# Patient Record
Sex: Female | Born: 1985 | Race: Black or African American | Hispanic: No | Marital: Single | Smoking: Former smoker
Health system: Southern US, Community
[De-identification: ages and names within clinical notes are randomized; demographics above are authoritative.]

## PROBLEM LIST (undated history)

## (undated) DIAGNOSIS — D649 Anemia, unspecified: Secondary | ICD-10-CM

## (undated) DIAGNOSIS — E669 Obesity, unspecified: Secondary | ICD-10-CM

## (undated) HISTORY — PX: NO PAST SURGERIES: SHX2092

---
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
## 2006-01-27 ENCOUNTER — Inpatient Hospital Stay (HOSPITAL_COMMUNITY): Admission: AD | Admit: 2006-01-27 | Discharge: 2006-01-29 | Payer: Self-pay | Admitting: Obstetrics

## 2006-05-10 IMAGING — CR DG FOOT COMPLETE 3+V*R*
3 series · 3 of 3 positions shown · non-contrast
Comparison: none

CLINICAL DATA: Lateral right foot pain following a fall 2 days ago.

RIGHT FOOT - 3 VIEW

[view not recorded (1 of 3)]
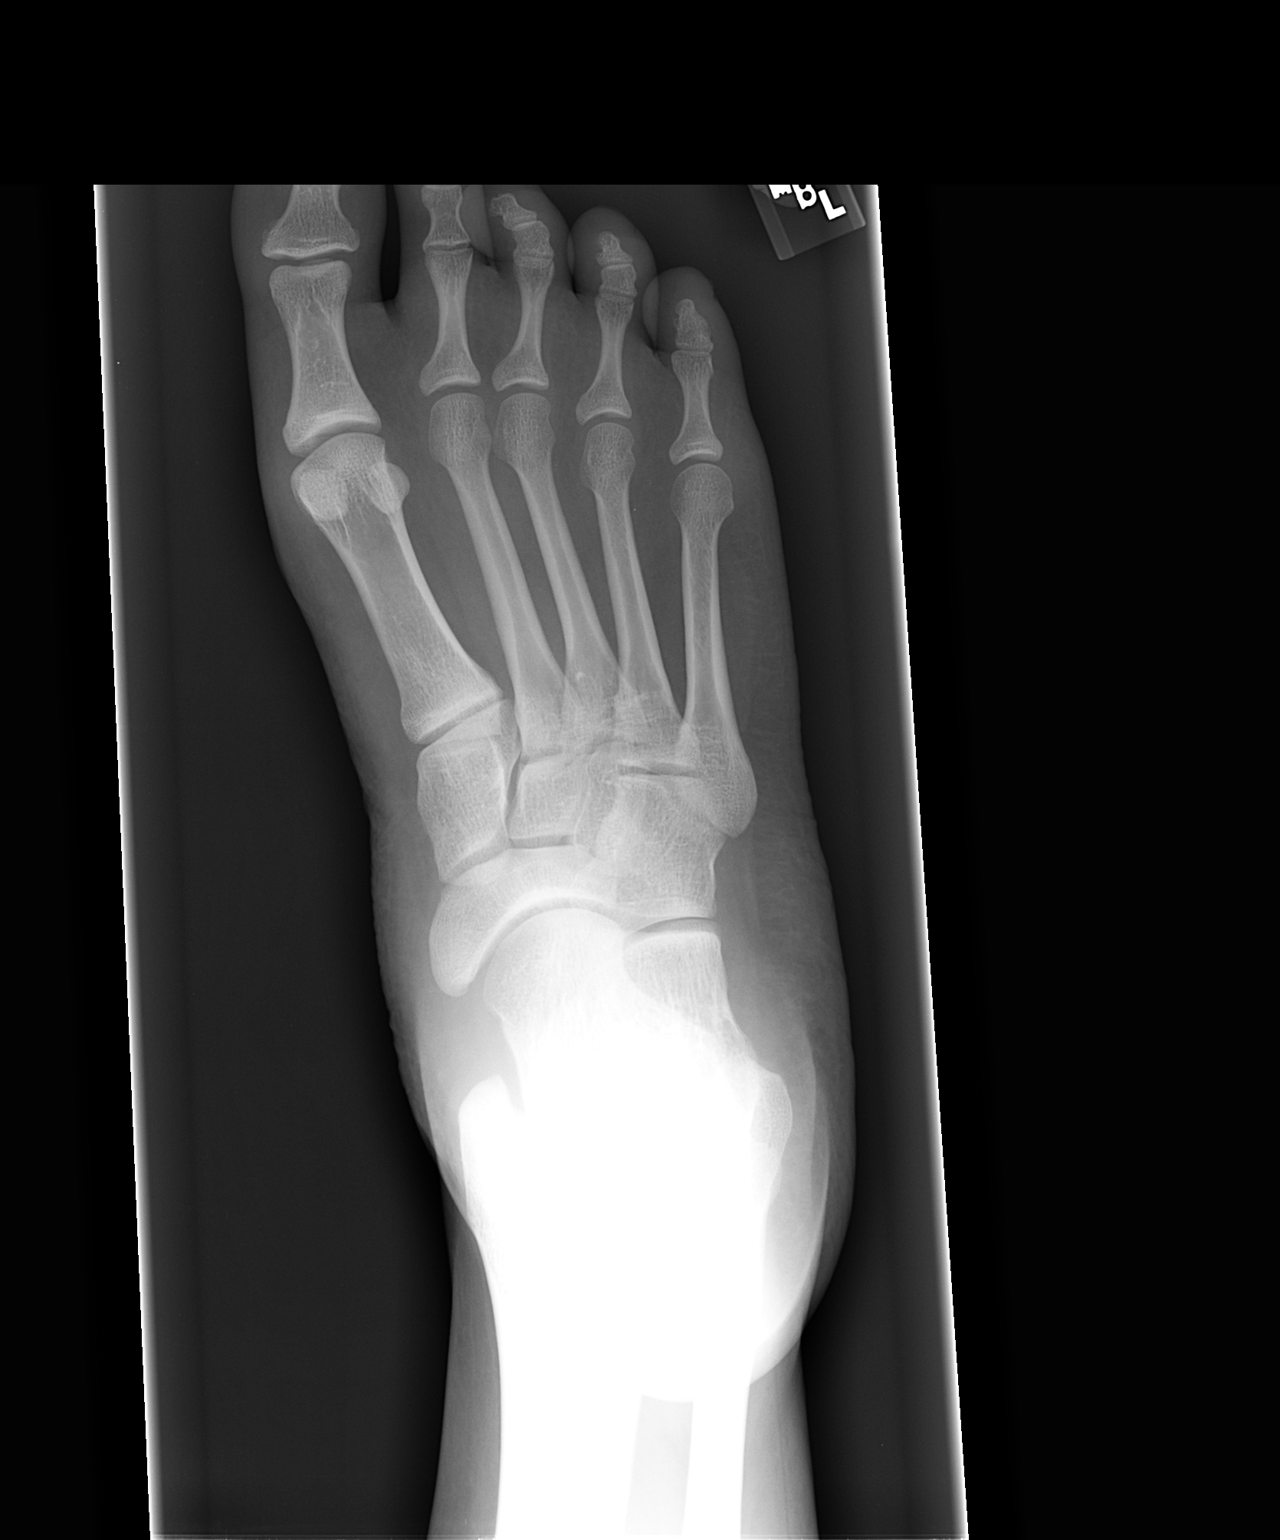

[view not recorded (2 of 3)]
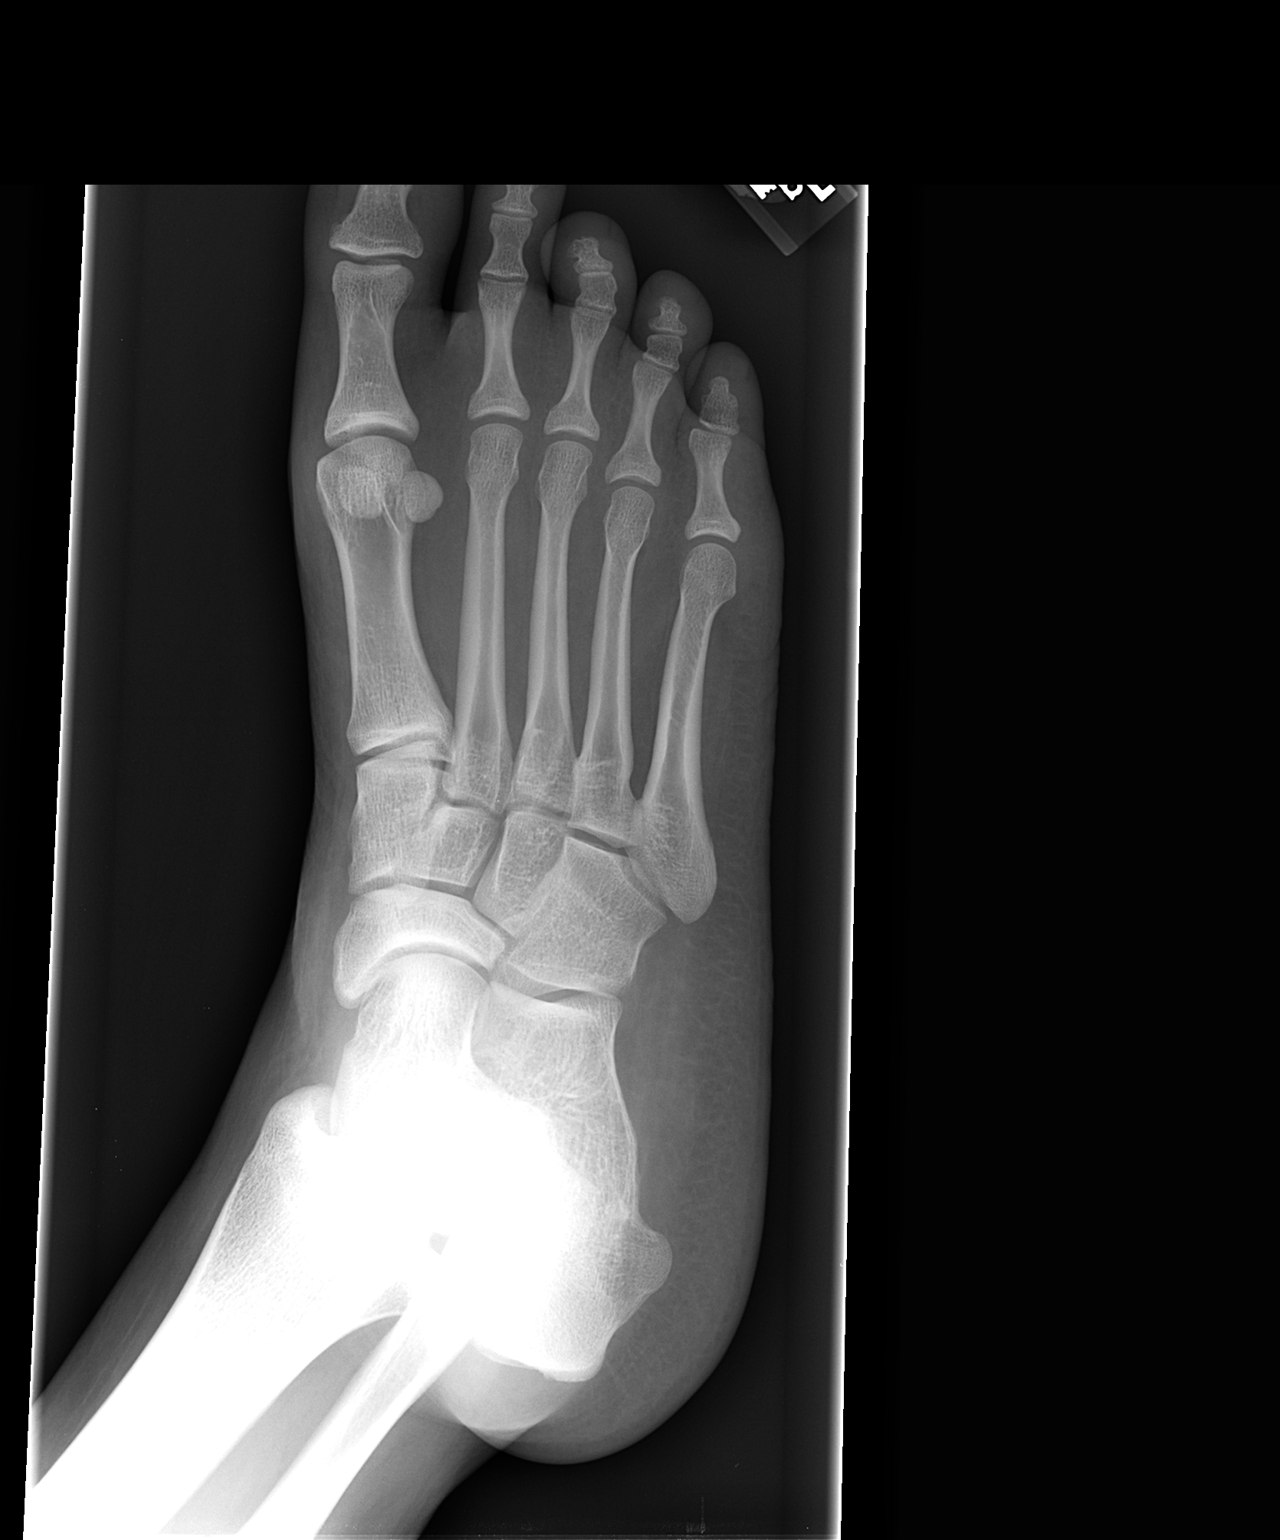

[view not recorded (3 of 3)]
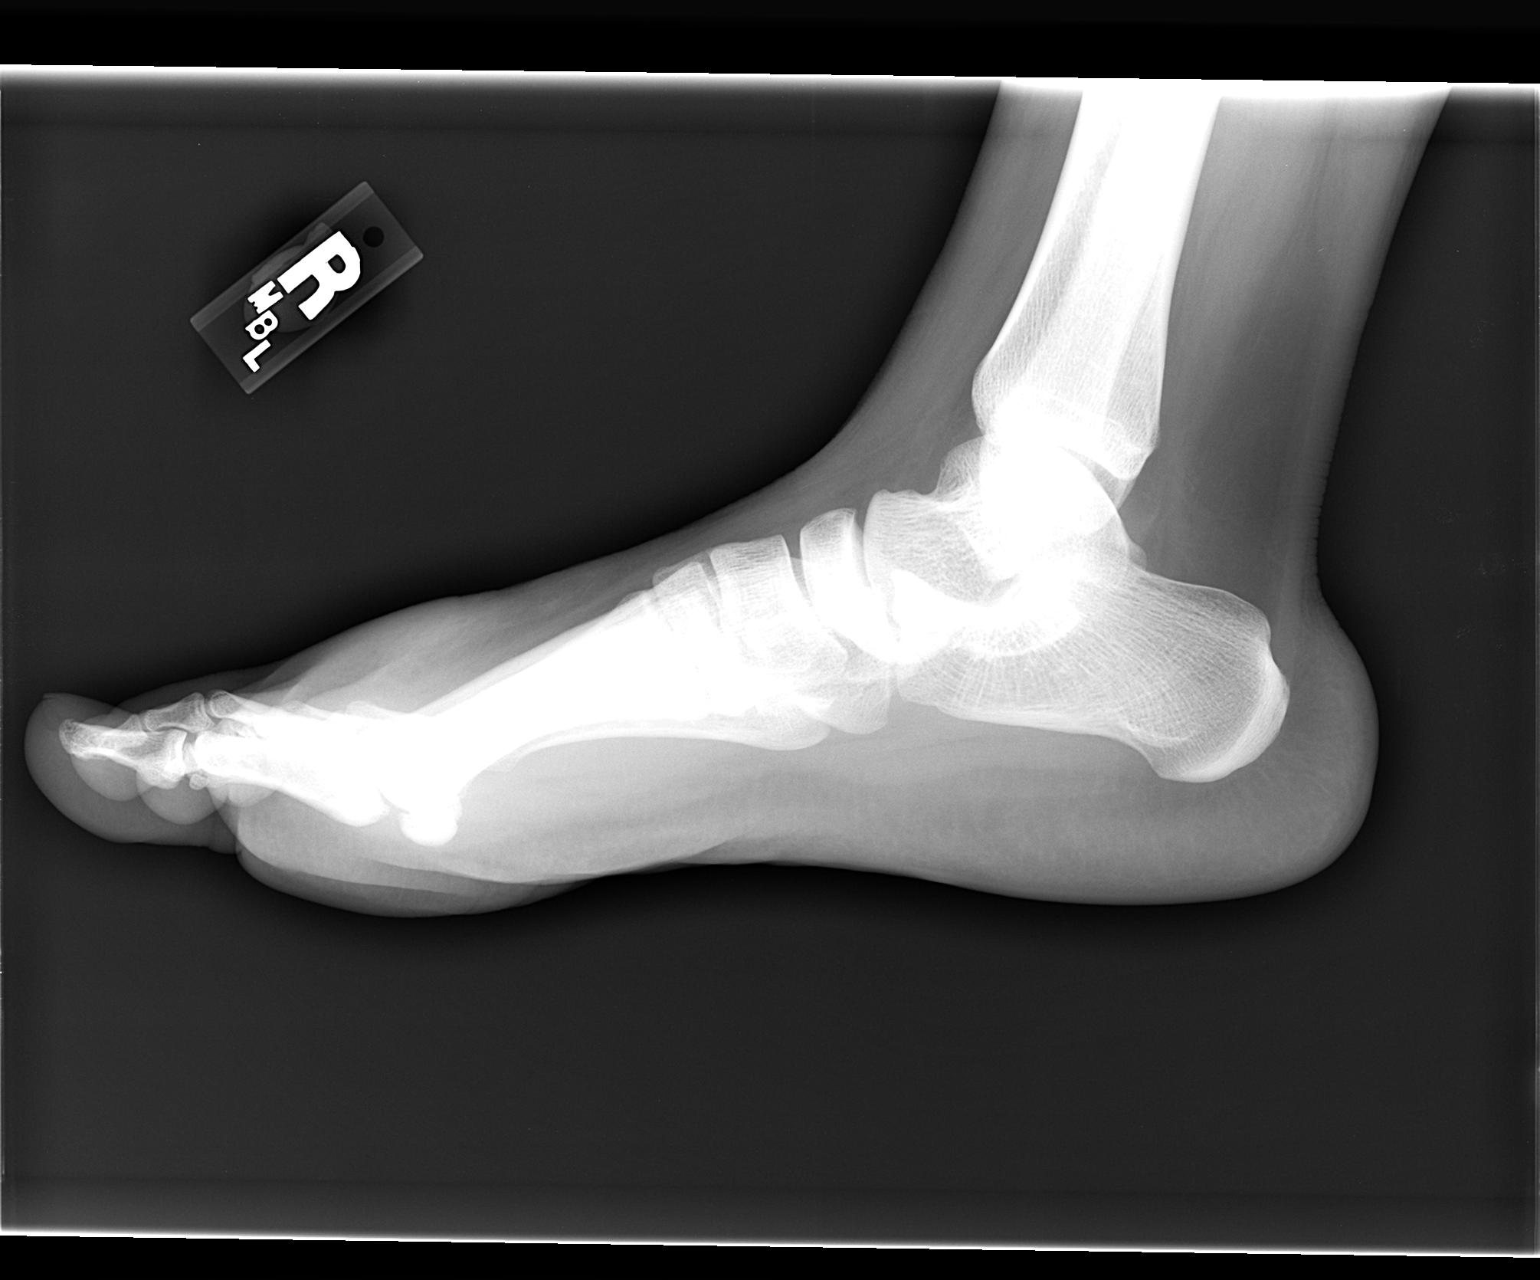

[3 of 3 positions shown; findings below may reference images not displayed]

FINDINGS: Dorsal soft tissue swelling in the distal aspect of the foot. No
fracture or dislocation seen.

IMPRESSION

Dorsal soft tissue swelling distally. No fracture.

## 2006-07-07 ENCOUNTER — Emergency Department (HOSPITAL_COMMUNITY): Admission: EM | Admit: 2006-07-07 | Discharge: 2006-07-07 | Payer: Self-pay | Admitting: Emergency Medicine

## 2007-06-02 ENCOUNTER — Emergency Department (HOSPITAL_COMMUNITY): Admission: EM | Admit: 2007-06-02 | Discharge: 2007-06-02 | Payer: Self-pay | Admitting: Emergency Medicine

## 2007-11-25 ENCOUNTER — Emergency Department (HOSPITAL_COMMUNITY): Admission: EM | Admit: 2007-11-25 | Discharge: 2007-11-25 | Payer: Self-pay | Admitting: Emergency Medicine

## 2008-06-17 ENCOUNTER — Emergency Department (HOSPITAL_COMMUNITY): Admission: EM | Admit: 2008-06-17 | Discharge: 2008-06-17 | Payer: Self-pay | Admitting: Emergency Medicine

## 2011-05-05 LAB — POCT PREGNANCY, URINE: Preg Test, Ur: NEGATIVE

## 2011-05-05 LAB — CBC
MCHC: 33.3
MCV: 80.8
Platelets: 321
WBC: 8.1

## 2011-05-05 LAB — DIFFERENTIAL
Basophils Absolute: 0
Basophils Relative: 0
Eosinophils Absolute: 0.3
Neutrophils Relative %: 58

## 2011-05-05 LAB — GC/CHLAMYDIA PROBE AMP, GENITAL
Chlamydia, DNA Probe: NEGATIVE
GC Probe Amp, Genital: NEGATIVE

## 2012-06-11 ENCOUNTER — Encounter (HOSPITAL_COMMUNITY): Payer: Self-pay | Admitting: *Deleted

## 2012-06-11 ENCOUNTER — Emergency Department (HOSPITAL_COMMUNITY)
Admission: EM | Admit: 2012-06-11 | Discharge: 2012-06-11 | Disposition: A | Discharge status: Home / Self Care | Payer: Self-pay | Attending: Emergency Medicine | Admitting: Emergency Medicine

## 2012-06-11 DIAGNOSIS — F17200 Nicotine dependence, unspecified, uncomplicated: Secondary | ICD-10-CM | POA: Insufficient documentation

## 2012-06-11 DIAGNOSIS — K029 Dental caries, unspecified: Secondary | ICD-10-CM | POA: Insufficient documentation

## 2012-06-11 MED ORDER — TRAMADOL HCL 50 MG PO TABS
50.0000 mg | ORAL_TABLET | Freq: Once | ORAL | Status: AC
  Administered 2012-06-11: 50 mg via ORAL
  Filled 2012-06-11: qty 1

## 2012-06-11 MED ORDER — TRAMADOL HCL 50 MG PO TABS: 50.0000 mg | ORAL_TABLET | Freq: Four times a day (QID) | ORAL | Status: DC | PRN

## 2012-06-11 NOTE — ED Notes (Signed)
Pt states that she has cavities and could not get them filled. Pt states that the teeth are now pretty much gone and she is having dental pain and gum pain and mouth pain for about a month.

## 2012-06-11 NOTE — ED Provider Notes (Signed)
History     CSN: [id number]  Arrival date & time 06/11/12  1478   First MD Initiated Contact with Patient 06/11/12 1950      Chief Complaint  Patient presents with  . Dental Pain    (Consider location/radiation/quality/duration/timing/severity/associated sxs/prior treatment) HPI Comments: Patient with dental pain for the past 7 yeas now with constant pain . Has been unable to see a dentist due to cost. Has been intermittent taking Goody's Powder without relief   Patient is a 26 y.o. female presenting with tooth pain. The history is provided by the patient.  Dental PainThe primary symptoms include mouth pain. Primary symptoms do not include headaches or fever. The symptoms began more than 1 month ago. The symptoms are worsening. The symptoms occur frequently.  Additional symptoms include: dental sensitivity to temperature. Additional symptoms do not include: gum swelling, gum tenderness, purulent gums, jaw pain, trouble swallowing and ear pain.    History reviewed. No pertinent past medical history.  History reviewed. No pertinent past surgical history.  History reviewed. No pertinent family history.  History  Substance Use Topics  . Smoking status: Current Every Day Smoker  . Smokeless tobacco: Not on file  . Alcohol Use: No    OB History    Grav Para Term Preterm Abortions TAB SAB Ect Mult Living                  Review of Systems  Constitutional: Negative for fever, activity change and appetite change.  HENT: Positive for dental problem. Negative for ear pain and trouble swallowing.   Gastrointestinal: Negative for vomiting.  Genitourinary: Negative.   Musculoskeletal: Negative.   Neurological: Negative for headaches.  All other systems reviewed and are negative.    Allergies  Review of patient's allergies indicates no known allergies.  Home Medications   Current Outpatient Rx  Name  Route  Sig  Dispense  Refill  . TRAMADOL HCL 50 MG PO TABS   Oral  Take 1 tablet (50 mg total) by mouth every 6 (six) hours as needed for pain.   30 tablet   0     BP 132/78  Pulse 109  Temp 98.9 F (37.2 C) (Oral)  Resp 18  SpO2 99%  Physical Exam  Nursing note and vitals reviewed. Constitutional: She is oriented to person, place, and time. She appears well-developed.       obese  HENT:  Head: Normocephalic.  Right Ear: External ear normal.  Left Ear: External ear normal.  Mouth/Throat: Oropharynx is clear and moist.         Multiple small cavities upper and lower molars No gum swelling   Eyes: Pupils are equal, round, and reactive to light.  Neck: Normal range of motion.  Cardiovascular: Regular rhythm.  Tachycardia present.   Pulmonary/Chest: Effort normal.  Musculoskeletal: Normal range of motion.  Neurological: She is alert and oriented to person, place, and time.  Skin: Skin is warm.    ED Course  Procedures (including critical care time)  Labs Reviewed - No data to display No results found.   1. Dental cavities       MDM  Dental caries        Arman Filter, NP 06/11/12 2016  Arman Filter, NP 06/11/12 2016

## 2012-06-11 NOTE — ED Provider Notes (Signed)
Medical screening examination/treatment/procedure(s) were performed by non-physician practitioner and as supervising physician I was immediately available for consultation/collaboration.   Rolan Bucco, MD 06/11/12 2250

## 2014-01-09 ENCOUNTER — Encounter (HOSPITAL_COMMUNITY): Payer: Self-pay | Admitting: Emergency Medicine

## 2014-01-09 ENCOUNTER — Emergency Department (HOSPITAL_COMMUNITY)
Admission: EM | Admit: 2014-01-09 | Discharge: 2014-01-09 | Disposition: A | Discharge status: Home / Self Care | Payer: Medicaid Other | Attending: Emergency Medicine | Admitting: Emergency Medicine

## 2014-01-09 DIAGNOSIS — Y990 Civilian activity done for income or pay: Secondary | ICD-10-CM | POA: Insufficient documentation

## 2014-01-09 DIAGNOSIS — W268XXA Contact with other sharp object(s), not elsewhere classified, initial encounter: Secondary | ICD-10-CM | POA: Insufficient documentation

## 2014-01-09 DIAGNOSIS — S61409A Unspecified open wound of unspecified hand, initial encounter: Secondary | ICD-10-CM | POA: Insufficient documentation

## 2014-01-09 DIAGNOSIS — Y93G1 Activity, food preparation and clean up: Secondary | ICD-10-CM | POA: Insufficient documentation

## 2014-01-09 DIAGNOSIS — Z23 Encounter for immunization: Secondary | ICD-10-CM | POA: Insufficient documentation

## 2014-01-09 DIAGNOSIS — S61412A Laceration without foreign body of left hand, initial encounter: Secondary | ICD-10-CM

## 2014-01-09 DIAGNOSIS — Y9289 Other specified places as the place of occurrence of the external cause: Secondary | ICD-10-CM | POA: Insufficient documentation

## 2014-01-09 DIAGNOSIS — F17200 Nicotine dependence, unspecified, uncomplicated: Secondary | ICD-10-CM | POA: Insufficient documentation

## 2014-01-09 MED ORDER — TETANUS-DIPHTH-ACELL PERTUSSIS 5-2.5-18.5 LF-MCG/0.5 IM SUSP
0.5000 mL | Freq: Once | INTRAMUSCULAR | Status: AC
  Administered 2014-01-09: 0.5 mL via INTRAMUSCULAR
  Filled 2014-01-09: qty 0.5

## 2014-01-09 NOTE — Discharge Instructions (Signed)
Keep your laceration clean and dry. Apply bacitracin twice a day. Follow up in 7 days for suture removal. Watch for signs of infection.   Laceration Care, Adult A laceration is a cut or lesion that goes through all layers of the skin and into the tissue just beneath the skin. TREATMENT  Some lacerations may not require closure. Some lacerations may not be able to be closed due to an increased risk of infection. It is important to see your caregiver as soon as possible after an injury to minimize the risk of infection and maximize the opportunity for successful closure. If closure is appropriate, pain medicines may be given, if needed. The wound will be cleaned to help prevent infection. Your caregiver will use stitches (sutures), staples, wound glue (adhesive), or skin adhesive strips to repair the laceration. These tools bring the skin edges together to allow for faster healing and a better cosmetic outcome. However, all wounds will heal with a scar. Once the wound has healed, scarring can be minimized by covering the wound with sunscreen during the day for 1 full year. HOME CARE INSTRUCTIONS  For sutures or staples:  Keep the wound clean and dry.  If you were given a bandage (dressing), you should change it at least once a day. Also, change the dressing if it becomes wet or dirty, or as directed by your caregiver.  Wash the wound with soap and water 2 times a day. Rinse the wound off with water to remove all soap. Pat the wound dry with a clean towel.  After cleaning, apply a thin layer of the antibiotic ointment as recommended by your caregiver. This will help prevent infection and keep the dressing from sticking.  You may shower as usual after the first 24 hours. Do not soak the wound in water until the sutures are removed.  Only take over-the-counter or prescription medicines for pain, discomfort, or fever as directed by your caregiver.  Get your sutures or staples removed as directed by  your caregiver. For skin adhesive strips:  Keep the wound clean and dry.  Do not get the skin adhesive strips wet. You may bathe carefully, using caution to keep the wound dry.  If the wound gets wet, pat it dry with a clean towel.  Skin adhesive strips will fall off on their own. You may trim the strips as the wound heals. Do not remove skin adhesive strips that are still stuck to the wound. They will fall off in time. For wound adhesive:  You may briefly wet your wound in the shower or bath. Do not soak or scrub the wound. Do not swim. Avoid periods of heavy perspiration until the skin adhesive has fallen off on its own. After showering or bathing, gently pat the wound dry with a clean towel.  Do not apply liquid medicine, cream medicine, or ointment medicine to your wound while the skin adhesive is in place. This may loosen the film before your wound is healed.  If a dressing is placed over the wound, be careful not to apply tape directly over the skin adhesive. This may cause the adhesive to be pulled off before the wound is healed.  Avoid prolonged exposure to sunlight or tanning lamps while the skin adhesive is in place. Exposure to ultraviolet light in the first year will darken the scar.  The skin adhesive will usually remain in place for 5 to 10 days, then naturally fall off the skin. Do not pick at the adhesive  film. You may need a tetanus shot if:  You cannot remember when you had your last tetanus shot.  You have never had a tetanus shot. If you get a tetanus shot, your arm may swell, get red, and feel warm to the touch. This is common and not a problem. If you need a tetanus shot and you choose not to have one, there is a rare chance of getting tetanus. Sickness from tetanus can be serious. SEEK MEDICAL CARE IF:   You have redness, swelling, or increasing pain in the wound.  You see a red line that goes away from the wound.  You have yellowish-white fluid (pus) coming  from the wound.  You have a fever.  You notice a bad smell coming from the wound or dressing.  Your wound breaks open before or after sutures have been removed.  You notice something coming out of the wound such as wood or glass.  Your wound is on your hand or foot and you cannot move a finger or toe. SEEK IMMEDIATE MEDICAL CARE IF:   Your pain is not controlled with prescribed medicine.  You have severe swelling around the wound causing pain and numbness or a change in color in your arm, hand, leg, or foot.  Your wound splits open and starts bleeding.  You have worsening numbness, weakness, or loss of function of any joint around or beyond the wound.  You develop painful lumps near the wound or on the skin anywhere on your body. MAKE SURE YOU:   Understand these instructions.  Will watch your condition.  Will get help right away if you are not doing well or get worse. Document Released: 07/13/2005 Document Revised: 10/05/2011 Document Reviewed: 01/06/2011 [facility] Patient Information 2014 [place], [place].

## 2014-01-09 NOTE — ED Provider Notes (Signed)
Medical screening examination/treatment/procedure(s) were performed by non-physician practitioner and as supervising physician I was immediately available for consultation/collaboration.   EKG Interpretation None        Gilda Creasehristopher J. Pollina, MD 01/09/14 1454

## 2014-01-09 NOTE — ED Notes (Signed)
.  5 cm Laceration to inner aspect of l/hand. Injury at 0400, cut on tea urn at work. Bleeding controlled

## 2014-01-09 NOTE — ED Provider Notes (Signed)
CSN: [id number]     Arrival date & time 01/09/14  1338 History  This chart was scribed for Jaynie Crumbleatyana Kirichenko, PA, working with Gilda Creasehristopher J. Pollina, *, by Bronson CurbJacqueline Melvin, ED Scribe. This patient was seen in room WTR6/WTR6 and the patient's care was started at 2:20 PM.     Chief Complaint  Patient presents with  . Extremity Laceration    .5 cm shallow laceration to l/hand    The history is provided by the patient. No language interpreter was used.    HPI Comments: Shannon Rivas is a 28 y.o. female who presents to the Emergency Department complaining of left hand laceration that occurred while she was washing dishes this morning at 3 AM. Patient states she sliced her left hand on a metal tea urn (tea urn is still intact). There is associated controlled bleeding and pain with extension. She rates her pain as 4/10. Patient is not UTD on tetanus.   History reviewed. No pertinent past medical history. History reviewed. No pertinent past surgical history. Family History  Problem Relation Age of Onset  . Diabetes Other   . Hypertension Other    History  Substance Use Topics  . Smoking status: Current Every Day Smoker    Types: Cigarettes  . Smokeless tobacco: Not on file  . Alcohol Use: No   OB History   Grav Para Term Preterm Abortions TAB SAB Ect Mult Living                 Review of Systems  Skin: Positive for wound.      Allergies  Review of patient's allergies indicates no known allergies.  Home Medications   Prior to Admission medications   Not on File   Triage Vitals: BP 105/62  Pulse 91  Temp(Src) 98.1 F (36.7 C) (Oral)  Resp 18  Wt 240 lb (108.863 kg)  SpO2 100%  LMP 08/27/2013  Physical Exam  Nursing note and vitals reviewed. Constitutional: She is oriented to person, place, and time. She appears well-developed and well-nourished. No distress.  HENT:  Head: Normocephalic and atraumatic.  Eyes: Conjunctivae and EOM are normal.  Neck: Neck  supple. No tracheal deviation present.  Cardiovascular: Normal rate.   Pulmonary/Chest: Effort normal. No respiratory distress.  Musculoskeletal: Normal range of motion.  Full ROM of 2nd finger, distal to the 2 cm laceration overlying palmar surface of MCP joint. Good strength against resistance with flexion and extension at each joint. Sensation intact over finger. Cap refill <2sec distally.   Neurological: She is alert and oriented to person, place, and time.  Skin: Skin is warm and dry.  2cm laceration to the left palm of the hand over 2nd MCP joint. Hemostatic.   Psychiatric: She has a normal mood and affect. Her behavior is normal.    ED Course  Procedures (including critical care time)  DIAGNOSTIC STUDIES: Oxygen Saturation is 100% on room air, normal by my interpretation.    COORDINATION OF CARE: At 1426 Discussed treatment plan with patient which includes laceration repair. Patient agrees.    LACERATION REPAIR PROCEDURE NOTE The patient's identification was confirmed and consent was obtained. This procedure was performed by Anselm LisStephanie Meiners PA-S under supervision of Tatyana Kirichenko PA-C at 2:29 PM. Site: left hand Sterile procedures observed  Anesthetic used (type and amt): 2% w epi 2cc Suture type/size:prolene 5.0 Length: 2cm # of Sutures: 3 Technique: simple interrupted Antibx ointment applied  Tetanus ordered Site anesthetized, irrigated with NS, explored without evidence of  foreign body, wound well approximated, site covered with dry, sterile dressing.  Patient tolerated procedure well without complications. Instructions for care discussed verbally and patient provided with additional written instructions for homecare and f/u.   Labs Review Labs Reviewed - No data to display  Imaging Review No results found.   EKG Interpretation None      MDM   Final diagnoses:  Laceration of hand, left    Patient's with a 2 cm laceration to the left hand. No  obvious tendon, nerve, vascular injury based on exam. Wound repaired with sutures. No concern about foreign body given cut on a metal object it still intact. Tetanus updated. Home with followup and suture removal.  Filed Vitals:   01/09/14 1402  BP: 105/62  Pulse: 91  Temp: 98.1 F (36.7 C)  TempSrc: Oral  Resp: 18  Weight: 240 lb (108.863 kg)  SpO2: 100%    I personally performed the services described in this documentation, which was scribed in my presence. The recorded information has been reviewed and is accurate.    Lottie Musselatyana A Kirichenko, PA-C 01/09/14 1450

## 2014-02-04 ENCOUNTER — Encounter (HOSPITAL_COMMUNITY): Payer: Self-pay | Admitting: Emergency Medicine

## 2014-02-04 ENCOUNTER — Emergency Department (HOSPITAL_COMMUNITY)
Admission: EM | Admit: 2014-02-04 | Discharge: 2014-02-04 | Disposition: A | Discharge status: Home / Self Care | Payer: Medicaid Other | Attending: Emergency Medicine | Admitting: Emergency Medicine

## 2014-02-04 ENCOUNTER — Emergency Department (HOSPITAL_COMMUNITY): Payer: Medicaid Other

## 2014-02-04 DIAGNOSIS — S335XXA Sprain of ligaments of lumbar spine, initial encounter: Secondary | ICD-10-CM | POA: Insufficient documentation

## 2014-02-04 DIAGNOSIS — S4980XA Other specified injuries of shoulder and upper arm, unspecified arm, initial encounter: Secondary | ICD-10-CM | POA: Insufficient documentation

## 2014-02-04 DIAGNOSIS — Y92410 Unspecified street and highway as the place of occurrence of the external cause: Secondary | ICD-10-CM | POA: Insufficient documentation

## 2014-02-04 DIAGNOSIS — E669 Obesity, unspecified: Secondary | ICD-10-CM | POA: Diagnosis not present

## 2014-02-04 DIAGNOSIS — M25511 Pain in right shoulder: Secondary | ICD-10-CM

## 2014-02-04 DIAGNOSIS — F17200 Nicotine dependence, unspecified, uncomplicated: Secondary | ICD-10-CM | POA: Insufficient documentation

## 2014-02-04 DIAGNOSIS — Y9389 Activity, other specified: Secondary | ICD-10-CM | POA: Diagnosis not present

## 2014-02-04 DIAGNOSIS — S39012A Strain of muscle, fascia and tendon of lower back, initial encounter: Secondary | ICD-10-CM

## 2014-02-04 DIAGNOSIS — S46909A Unspecified injury of unspecified muscle, fascia and tendon at shoulder and upper arm level, unspecified arm, initial encounter: Secondary | ICD-10-CM | POA: Insufficient documentation

## 2014-02-04 HISTORY — DX: Obesity, unspecified: E66.9

## 2014-02-04 MED ORDER — HYDROCODONE-ACETAMINOPHEN 5-325 MG PO TABS: 1.0000 | ORAL_TABLET | Freq: Four times a day (QID) | ORAL | Status: DC | PRN

## 2014-02-04 MED ORDER — DIAZEPAM 5 MG PO TABS: 5.0000 mg | ORAL_TABLET | Freq: Two times a day (BID) | ORAL | Status: DC | PRN

## 2014-02-04 NOTE — Discharge Instructions (Signed)
Lumbosacral Strain Lumbosacral strain is a strain of any of the parts that make up your lumbosacral vertebrae. Your lumbosacral vertebrae are the bones that make up the lower third of your backbone. Your lumbosacral vertebrae are held together by muscles and tough, fibrous tissue (ligaments).  CAUSES  A sudden blow to your back can cause lumbosacral strain. Also, anything that causes an excessive stretch of the muscles in the low back can cause this strain. This is typically seen when people exert themselves strenuously, fall, lift heavy objects, bend, or crouch repeatedly. RISK FACTORS  Physically demanding work.  Participation in pushing or pulling sports or sports that require a sudden twist of the back (tennis, golf, baseball).  Weight lifting.  Excessive lower back curvature.  Forward-tilted pelvis.  Weak back or abdominal muscles or both.  Tight hamstrings. SIGNS AND SYMPTOMS  Lumbosacral strain may cause pain in the area of your injury or pain that moves (radiates) down your leg.  DIAGNOSIS Your health care provider can often diagnose lumbosacral strain through a physical exam. In some cases, you may need tests such as X-ray exams.  TREATMENT  Treatment for your lower back injury depends on many factors that your clinician will have to evaluate. However, most treatment will include the use of anti-inflammatory medicines. HOME CARE INSTRUCTIONS   Avoid hard physical activities (tennis, racquetball, waterskiing) if you are not in proper physical condition for it. This may aggravate or create problems.  If you have a back problem, avoid sports requiring sudden body movements. Swimming and walking are generally safer activities.  Maintain good posture.  Maintain a healthy weight.  For acute conditions, you may put ice on the injured area.  Put ice in a plastic bag.  Place a towel between your skin and the bag.  Leave the ice on for 20 minutes, 2-3 times a day.  When the  low back starts healing, stretching and strengthening exercises may be recommended. SEEK MEDICAL CARE IF:  Your back pain is getting worse.  You experience severe back pain not relieved with medicines. SEEK IMMEDIATE MEDICAL CARE IF:   You have numbness, tingling, weakness, or problems with the use of your arms or legs.  There is a change in bowel or bladder control.  You have increasing pain in any area of the body, including your belly (abdomen).  You notice shortness of breath, dizziness, or feel faint.  You feel sick to your stomach (nauseous), are throwing up (vomiting), or become sweaty.  You notice discoloration of your toes or legs, or your feet get very cold. MAKE SURE YOU:   Understand these instructions.  Will watch your condition.  Will get help right away if you are not doing well or get worse. Document Released: 04/22/2005 Document Revised: 07/18/2013 Document Reviewed: 03/01/2013 [facility] Patient Information 2015 [place], [place]. This information is not intended to replace advice given to you by your health care provider. Make sure you discuss any questions you have with your health care provider. Shoulder Pain The shoulder is the joint that connects your arms to your body. The bones that form the shoulder joint include the upper arm bone (humerus), the shoulder blade (scapula), and the collarbone (clavicle). The top of the humerus is shaped like a ball and fits into a rather flat socket on the scapula (glenoid cavity). A combination of muscles and strong, fibrous tissues that connect muscles to bones (tendons) support your shoulder joint and hold the ball in the socket. Small, fluid-filled sacs (bursae)  are located in different areas of the joint. They act as cushions between the bones and the overlying soft tissues and help reduce friction between the gliding tendons and the bone as you move your arm. Your shoulder joint allows a wide range of motion in your arm. This  range of motion allows you to do things like scratch your back or throw a ball. However, this range of motion also makes your shoulder more prone to pain from overuse and injury. Causes of shoulder pain can originate from both injury and overuse and usually can be grouped in the following four categories:  Redness, swelling, and pain (inflammation) of the tendon (tendinitis) or the bursae (bursitis).  Instability, such as a dislocation of the joint.  Inflammation of the joint (arthritis).  Broken bone (fracture). HOME CARE INSTRUCTIONS   Apply ice to the sore area.  Put ice in a plastic bag.  Place a towel between your skin and the bag.  Leave the ice on for 15-20 minutes, 3-4 times per day for the first 2 days, or as directed by your health care provider.  Stop using cold packs if they do not help with the pain.  If you have a shoulder sling or immobilizer, wear it as long as your caregiver instructs. Only remove it to shower or bathe. Move your arm as little as possible, but keep your hand moving to prevent swelling.  Squeeze a soft ball or foam pad as much as possible to help prevent swelling.  Only take over-the-counter or prescription medicines for pain, discomfort, or fever as directed by your caregiver. SEEK MEDICAL CARE IF:   Your shoulder pain increases, or new pain develops in your arm, hand, or fingers.  Your hand or fingers become cold and numb.  Your pain is not relieved with medicines. SEEK IMMEDIATE MEDICAL CARE IF:   Your arm, hand, or fingers are numb or tingling.  Your arm, hand, or fingers are significantly swollen or turn white or blue. MAKE SURE YOU:   Understand these instructions.  Will watch your condition.  Will get help right away if you are not doing well or get worse. Document Released: 04/22/2005 Document Revised: 07/18/2013 Document Reviewed: 06/27/2011 [facility] Patient Information 2015 [place], [place]. This information is not intended to  replace advice given to you by your health care provider. Make sure you discuss any questions you have with your health care provider.
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
## 2014-02-12 ENCOUNTER — Encounter (HOSPITAL_COMMUNITY): Payer: Self-pay | Admitting: Emergency Medicine

## 2014-02-12 ENCOUNTER — Emergency Department (HOSPITAL_COMMUNITY)
Admission: EM | Admit: 2014-02-12 | Discharge: 2014-02-12 | Disposition: A | Discharge status: Home / Self Care | Payer: No Typology Code available for payment source | Attending: Emergency Medicine | Admitting: Emergency Medicine

## 2014-02-12 ENCOUNTER — Emergency Department (INDEPENDENT_AMBULATORY_CARE_PROVIDER_SITE_OTHER)
Admission: EM | Admit: 2014-02-12 | Discharge: 2014-02-12 | Disposition: A | Discharge status: Nursing Home (Includes ICF) | Payer: Self-pay | Source: Home / Self Care | Attending: Family Medicine | Admitting: Family Medicine

## 2014-02-12 DIAGNOSIS — M549 Dorsalgia, unspecified: Secondary | ICD-10-CM

## 2014-02-12 DIAGNOSIS — G8911 Acute pain due to trauma: Secondary | ICD-10-CM | POA: Insufficient documentation

## 2014-02-12 DIAGNOSIS — Z760 Encounter for issue of repeat prescription: Secondary | ICD-10-CM | POA: Diagnosis present

## 2014-02-12 DIAGNOSIS — F17200 Nicotine dependence, unspecified, uncomplicated: Secondary | ICD-10-CM | POA: Insufficient documentation

## 2014-02-12 DIAGNOSIS — M542 Cervicalgia: Secondary | ICD-10-CM

## 2014-02-12 DIAGNOSIS — E669 Obesity, unspecified: Secondary | ICD-10-CM | POA: Diagnosis not present

## 2014-02-12 DIAGNOSIS — M25519 Pain in unspecified shoulder: Secondary | ICD-10-CM | POA: Diagnosis not present

## 2014-02-12 DIAGNOSIS — M25511 Pain in right shoulder: Secondary | ICD-10-CM

## 2014-02-12 MED ORDER — DIAZEPAM 5 MG PO TABS: 5.0000 mg | ORAL_TABLET | Freq: Two times a day (BID) | ORAL | Status: DC | PRN

## 2014-02-12 MED ORDER — HYDROCODONE-ACETAMINOPHEN 5-325 MG PO TABS
1.0000 | ORAL_TABLET | Freq: Four times a day (QID) | ORAL | Status: DC | PRN
Start: 2014-02-12 — End: 2017-05-29

## 2014-02-12 MED ORDER — DIAZEPAM 5 MG PO TABS
5.0000 mg | ORAL_TABLET | Freq: Once | ORAL | Status: AC
  Administered 2014-02-12: 5 mg via ORAL
  Filled 2014-02-12: qty 1

## 2014-02-12 MED ORDER — HYDROCODONE-ACETAMINOPHEN 5-325 MG PO TABS
1.0000 | ORAL_TABLET | Freq: Once | ORAL | Status: AC
  Administered 2014-02-12: 1 via ORAL
  Filled 2014-02-12: qty 1

## 2014-02-12 NOTE — ED Notes (Signed)
Pt   Thinks  She  Should    Not  Have  Top  Pay  For  This   Visit   Tobe SosScott  Eunice  From  adminstration    Spoke  To pt

## 2014-02-12 NOTE — ED Provider Notes (Signed)
Medical screening examination/treatment/procedure(s) were performed by non-physician practitioner and as supervising physician I was immediately available for consultation/collaboration.   EKG Interpretation None        Illianna Paschal, MD 02/12/14 1513 
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
## 2014-02-12 NOTE — Discharge Instructions (Signed)
You have been given pain medications that you were previously prescribed for your pain. Follow orthopedic and chiropractic doctors at your regularly scheduled appointment. Return to the emergency room for any changes or worsening symptoms.   Medication Refill, Emergency Department We have refilled your medication today as a courtesy to you. It is best for your medical care, however, to take care of getting refills done through your primary caregiver's office. They have your records and can do a better job of follow-up than we can in the emergency department. On maintenance medications, we often only prescribe enough medications to get you by until you are able to see your regular caregiver. This is a more expensive way to refill medications. In the future, please plan for refills so that you will not have to use the emergency department for this. Thank you for your help. Your help allows [place] to better take care of the daily emergencies that enter our department. Document Released: 10/30/2003 Document Revised: 10/05/2011 Document Reviewed: 07/13/2005 [facility] Patient Information 2015 [place], [place]. This information is not intended to replace advice given to you by your health care provider. Make sure you discuss any questions you have with your health care provider.  Musculoskeletal Pain Musculoskeletal pain is muscle and boney aches and pains. These pains can occur in any part of the body. Your caregiver may treat you without knowing the cause of the pain. They may treat you if blood or urine tests, X-rays, and other tests were normal.  CAUSES There is often not a definite cause or reason for these pains. These pains may be caused by a type of germ (virus). The discomfort may also come from overuse. Overuse includes working out too hard when your body is not fit. Boney aches also come from weather changes. Bone is sensitive to atmospheric pressure changes. HOME CARE INSTRUCTIONS   Ask when your  test results will be ready. Make sure you get your test results.  Only take over-the-counter or prescription medicines for pain, discomfort, or fever as directed by your caregiver. If you were given medications for your condition, do not drive, operate machinery or power tools, or sign legal documents for 24 hours. Do not drink alcohol. Do not take sleeping pills or other medications that may interfere with treatment.  Continue all activities unless the activities cause more pain. When the pain lessens, slowly resume normal activities. Gradually increase the intensity and duration of the activities or exercise.  During periods of severe pain, bed rest may be helpful. Lay or sit in any position that is comfortable.  Putting ice on the injured area.  Put ice in a bag.  Place a towel between your skin and the bag.  Leave the ice on for 15 to 20 minutes, 3 to 4 times a day.  Follow up with your caregiver for continued problems and no reason can be found for the pain. If the pain becomes worse or does not go away, it may be necessary to repeat tests or do additional testing. Your caregiver may need to look further for a possible cause. SEEK IMMEDIATE MEDICAL CARE IF:  You have pain that is getting worse and is not relieved by medications.  You develop chest pain that is associated with shortness or breath, sweating, feeling sick to your stomach (nauseous), or throw up (vomit).  Your pain becomes localized to the abdomen.  You develop any new symptoms that seem different or that concern you. MAKE SURE YOU:   Understand these  instructions.  Will watch your condition.  Will get help right away if you are not doing well or get worse. Document Released: 07/13/2005 Document Revised: 10/05/2011 Document Reviewed: 03/17/2013 [facility] Patient Information 2015 [place]. This information is not intended to replace advice given to you by your health care provider. Make sure you discuss any  questions you have with your health care provider.
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
## 2014-02-13 NOTE — ED Provider Notes (Signed)
Medical screening examination/treatment/procedure(s) were performed by non-physician practitioner and as supervising physician I was immediately available for consultation/collaboration.   EKG Interpretation None        William Lenah Messenger, MD 02/13/14 2253 

## 2014-04-10 ENCOUNTER — Ambulatory Visit: Payer: No Typology Code available for payment source

## 2014-04-17 ENCOUNTER — Ambulatory Visit: Payer: Medicaid Other | Attending: Orthopedic Surgery | Admitting: Physical Therapy

## 2014-04-17 DIAGNOSIS — M25519 Pain in unspecified shoulder: Secondary | ICD-10-CM | POA: Diagnosis not present

## 2014-04-17 DIAGNOSIS — R5381 Other malaise: Secondary | ICD-10-CM | POA: Insufficient documentation

## 2014-04-17 DIAGNOSIS — M25619 Stiffness of unspecified shoulder, not elsewhere classified: Secondary | ICD-10-CM | POA: Diagnosis not present

## 2014-04-17 DIAGNOSIS — IMO0001 Reserved for inherently not codable concepts without codable children: Secondary | ICD-10-CM | POA: Diagnosis present

## 2014-04-18 ENCOUNTER — Ambulatory Visit: Payer: Medicaid Other

## 2014-04-23 ENCOUNTER — Ambulatory Visit: Payer: Medicaid Other | Admitting: Physical Therapy

## 2014-04-23 DIAGNOSIS — IMO0001 Reserved for inherently not codable concepts without codable children: Secondary | ICD-10-CM | POA: Diagnosis not present

## 2014-04-24 ENCOUNTER — Ambulatory Visit: Payer: Medicaid Other

## 2014-04-25 ENCOUNTER — Encounter: Payer: Medicaid Other | Admitting: Physical Therapy

## 2014-04-25 ENCOUNTER — Ambulatory Visit: Payer: Medicaid Other | Admitting: Physical Therapy

## 2014-04-27 ENCOUNTER — Encounter: Payer: Medicaid Other | Admitting: Physical Therapy

## 2014-04-27 ENCOUNTER — Ambulatory Visit: Payer: Medicaid Other | Attending: Orthopedic Surgery | Admitting: Physical Therapy

## 2014-04-27 DIAGNOSIS — Z5189 Encounter for other specified aftercare: Secondary | ICD-10-CM | POA: Insufficient documentation

## 2014-04-27 DIAGNOSIS — M25611 Stiffness of right shoulder, not elsewhere classified: Secondary | ICD-10-CM | POA: Diagnosis not present

## 2014-04-27 DIAGNOSIS — M25511 Pain in right shoulder: Secondary | ICD-10-CM | POA: Insufficient documentation

## 2014-04-27 DIAGNOSIS — R5381 Other malaise: Secondary | ICD-10-CM | POA: Insufficient documentation

## 2014-04-30 ENCOUNTER — Encounter: Payer: Medicaid Other | Admitting: Physical Therapy

## 2014-04-30 ENCOUNTER — Ambulatory Visit: Payer: Medicaid Other | Admitting: Physical Therapy

## 2014-05-02 ENCOUNTER — Encounter: Payer: Medicaid Other | Admitting: Physical Therapy

## 2014-05-02 ENCOUNTER — Ambulatory Visit: Payer: Medicaid Other | Admitting: Physical Therapy

## 2014-05-04 ENCOUNTER — Ambulatory Visit: Payer: Medicaid Other | Admitting: Physical Therapy

## 2014-05-04 ENCOUNTER — Encounter: Payer: Medicaid Other | Admitting: Physical Therapy

## 2014-05-07 ENCOUNTER — Ambulatory Visit: Payer: Medicaid Other | Admitting: Physical Therapy

## 2014-05-07 ENCOUNTER — Encounter: Payer: Medicaid Other | Admitting: Physical Therapy

## 2014-05-09 ENCOUNTER — Encounter: Payer: Medicaid Other | Admitting: Physical Therapy

## 2014-05-11 ENCOUNTER — Encounter: Payer: Medicaid Other | Admitting: Physical Therapy

## 2014-05-14 ENCOUNTER — Encounter: Payer: Medicaid Other | Admitting: Physical Therapy

## 2014-05-16 ENCOUNTER — Encounter: Payer: Medicaid Other | Admitting: Physical Therapy

## 2014-05-18 ENCOUNTER — Encounter: Payer: Medicaid Other | Admitting: Physical Therapy

## 2014-05-21 ENCOUNTER — Encounter: Payer: Medicaid Other | Admitting: Physical Therapy

## 2014-05-23 ENCOUNTER — Encounter: Payer: Medicaid Other | Admitting: Physical Therapy

## 2014-05-25 ENCOUNTER — Encounter: Payer: Medicaid Other | Admitting: Physical Therapy

## 2016-01-09 ENCOUNTER — Emergency Department (HOSPITAL_COMMUNITY): Payer: Self-pay

## 2016-01-09 ENCOUNTER — Encounter (HOSPITAL_COMMUNITY): Payer: Self-pay | Admitting: Emergency Medicine

## 2016-01-09 ENCOUNTER — Emergency Department (HOSPITAL_COMMUNITY)
Admission: EM | Admit: 2016-01-09 | Discharge: 2016-01-09 | Disposition: A | Discharge status: Home / Self Care | Payer: Self-pay | Attending: Emergency Medicine | Admitting: Emergency Medicine

## 2016-01-09 DIAGNOSIS — Z79899 Other long term (current) drug therapy: Secondary | ICD-10-CM | POA: Insufficient documentation

## 2016-01-09 DIAGNOSIS — N938 Other specified abnormal uterine and vaginal bleeding: Secondary | ICD-10-CM | POA: Insufficient documentation

## 2016-01-09 DIAGNOSIS — M79675 Pain in left toe(s): Secondary | ICD-10-CM | POA: Insufficient documentation

## 2016-01-09 DIAGNOSIS — F17210 Nicotine dependence, cigarettes, uncomplicated: Secondary | ICD-10-CM | POA: Insufficient documentation

## 2016-01-09 LAB — WET PREP, GENITAL
Sperm: NONE SEEN
Trich, Wet Prep: NONE SEEN
YEAST WET PREP: NONE SEEN

## 2016-01-09 LAB — I-STAT CHEM 8, ED
BUN: 10 mg/dL (ref 6–20)
CHLORIDE: 105 mmol/L (ref 101–111)
Calcium, Ion: 1.21 mmol/L (ref 1.12–1.23)
Creatinine, Ser: 0.8 mg/dL (ref 0.44–1.00)
Glucose, Bld: 80 mg/dL (ref 65–99)
HEMATOCRIT: 39 % (ref 36.0–46.0)
Hemoglobin: 13.3 g/dL (ref 12.0–15.0)
POTASSIUM: 3.6 mmol/L (ref 3.5–5.1)
SODIUM: 142 mmol/L (ref 135–145)
TCO2: 26 mmol/L (ref 0–100)

## 2016-01-09 LAB — I-STAT BETA HCG BLOOD, ED (MC, WL, AP ONLY): I-stat hCG, quantitative: <5 m[IU]/mL (ref <5)

## 2016-01-09 LAB — RPR: RPR Ser Ql: NONREACTIVE

## 2016-01-09 LAB — HIV ANTIBODY (ROUTINE TESTING W REFLEX): HIV SCREEN 4TH GENERATION: NONREACTIVE

## 2016-01-09 NOTE — ED Notes (Signed)
Discharge instructions and follow up care reviewed with patient. Patient verbalized understanding. 

## 2016-01-09 NOTE — ED Provider Notes (Signed)
CSN: [id number]     Arrival date & time 01/09/16  0351 History   First MD Initiated Contact with Patient 01/09/16 [phone]     Chief Complaint  Patient presents with  . Menometrorrhagia     (Consider location/radiation/quality/duration/timing/severity/associated sxs/prior Treatment) HPI   Blood pressure 108/57, pulse 69, temperature 98 F (36.7 C), temperature source Oral, resp. rate 18, height  (1.6 m), weight 104.781 kg, SpO2 99 %.  Shannon Rivas is a 30 y.o. female complaining of irregular periods over the last several years, has not followed up with OB/GYN because she doesn't have insurance, states she's been demonstrating constantly for the last month, states she's only spotting now. She denies any prior history of transfusion, chest pain, palpitations, shortness of breath, syncope. She had unprotected sex one week ago and would like to be tested for STDs and would also like a Pap smear. Patient denies abdominal pain, fever, abnormal vaginal discharge, change in urination, chest pain, shortness of breath. Patient also has some pain to left great toe status post slip and fall. She is immature without issue, she rates her pain at 6 out of 10, exacerbated by weightbearing.  Past Medical History  Diagnosis Date  . Obesity    History reviewed. No pertinent past surgical history. Family History  Problem Relation Age of Onset  . Diabetes Mother   . Hypertension Mother   . Thyroid disease Mother   . Ulcers Father    Social History  Substance Use Topics  . Smoking status: Current Every Day Smoker    Types: Cigarettes  . Smokeless tobacco: None  . Alcohol Use: Yes     Comment: socially    OB History    No data available     Review of Systems  10 systems reviewed and found to be negative, except as noted in the HPI.   Allergies  Review of patient's allergies indicates no known allergies.  Home Medications   Prior to Admission medications   Medication Sig Start Date  End Date Taking? Authorizing Provider  BIOTIN PO Take 1 tablet by mouth daily.   Yes Historical Provider, MD  diazepam (VALIUM) 5 MG tablet Take 1 tablet (5 mg total) by mouth every 12 (twelve) hours as needed for anxiety. Patient not taking: Reported on 01/09/2016 02/04/14   Roxy Horseman, PA-C  diazepam (VALIUM) 5 MG tablet Take 1 tablet (5 mg total) by mouth every 12 (twelve) hours as needed for muscle spasms (and pain). Patient not taking: Reported on 01/09/2016 02/12/14   Mercedes Camprubi-Soms, PA-C  HYDROcodone-acetaminophen (NORCO) 5-325 MG per tablet Take 1-2 tablets by mouth every 6 (six) hours as needed for severe pain. Patient not taking: Reported on 01/09/2016 02/12/14   Mercedes Camprubi-Soms, PA-C   BP 108/57 mmHg  Pulse 69  Temp(Src) 98 F (36.7 C) (Oral)  Resp 16  Ht  (1.6 m)  Wt 104.781 kg  BMI 40.93 kg/m2  SpO2 99% Physical Exam  Constitutional: She is oriented to person, place, and time. She appears well-developed and well-nourished. No distress.  HENT:  Head: Normocephalic and atraumatic.  Mouth/Throat: Oropharynx is clear and moist.  Eyes: Conjunctivae and EOM are normal. Pupils are equal, round, and reactive to light.  Neck: Normal range of motion.  Cardiovascular: Normal rate, regular rhythm and intact distal pulses.   Pulmonary/Chest: Effort normal and breath sounds normal.  Abdominal: Soft. There is no tenderness.  Genitourinary:  Pelvic exam a chaperoned by nurse: No rashes or lesions,  there is a scant amount of dark blood pooled in the posterior fornix, no cervical motion or adnexal tenderness.  Musculoskeletal: Normal range of motion. She exhibits tenderness. She exhibits no edema.  Left great toe is diffusely tender to palpation, no focal bony tenderness. She is distally neurovascularly intact. Excellent range of motion to MTP and interphalangeal joint.  Neurological: She is alert and oriented to person, place, and time.  Skin: She is not diaphoretic.   Psychiatric: She has a normal mood and affect.  Nursing note and vitals reviewed.   ED Course  Procedures (including critical care time) Labs Review Labs Reviewed  WET PREP, GENITAL - Abnormal; Notable for the following:    Clue Cells Wet Prep HPF POC PRESENT (*)    WBC, Wet Prep HPF POC MODERATE (*)    All other components within normal limits  RPR  HIV ANTIBODY (ROUTINE TESTING)  I-STAT BETA HCG BLOOD, ED ([facility], [facility], [facility] ONLY)  I-STAT CHEM 8, ED  GC/CHLAMYDIA PROBE AMP ([place]) NOT AT [facility]    Imaging Review Dg Toe Great Left  01/09/2016  CLINICAL DATA:  Fall down steps 1 week ago with persistent left great toe pain. Initial encounter. EXAM: LEFT GREAT TOE COMPARISON:  None. FINDINGS: There is no evidence of fracture or dislocation. Soft tissues are unremarkable. IMPRESSION: Negative. Electronically Signed   By: Marnee SpringJonathon  Watts M.D.   On: 01/09/2016 07:32   I have personally reviewed and evaluated these images and lab results as part of my medical decision-making.   EKG Interpretation None      MDM   Final diagnoses:  DUB (dysfunctional uterine bleeding)  Toe pain, left    Filed Vitals:   01/09/16 0412 01/09/16 0524 01/09/16 0525 01/09/16 0600  BP: 130/68 133/114  108/57  Pulse: 85  89 69  Temp: 98 F (36.7 C)     TempSrc: Oral     Resp: 18   16  Height: 5\' 3"  (1.6 m)     Weight: 104.781 kg     SpO2: 100%  100% 99%     Stephanie AcreJasmine R Rivas is 30 y.o. female presenting with Dysfunctional uterine bleeding over the course of years, has been spotting over the last month. No signs of a symptomatically anemia, patient is not anemic with a hemoglobin of 13.3. Pelvic exam without acute abnormality, scant amount of blood. Her wet prep shows clue cells however she is not complaining of vaginal discharge, no indication to treat bacterial vaginosis in this scenario. Patient is encouraged to follow closely with OB/GYN.  Pregnancy test is negative, imaging of toe without  fracture.  Evaluation does not show pathology that would require ongoing emergent intervention or inpatient treatment. Pt is hemodynamically stable and mentating appropriately. Discussed findings and plan with patient/guardian, who agrees with care plan. All questions answered. Return precautions discussed and outpatient follow up given.      Wynetta Emeryicole Tacia Hindley, PA-C 01/09/16 [id number]  Dione Boozeavid Glick, MD 01/09/16 [phone]

## 2016-01-09 NOTE — ED Notes (Signed)
PA at bedside.

## 2016-01-09 NOTE — ED Notes (Signed)
Pelvic exam set up 

## 2016-01-09 NOTE — ED Notes (Signed)
Pt states she has always had irregular periods  Pt states she can go 1 to 4 years without one and then she bleeds for 3 months   Pt states for the past 4 mths her periods having been monthly but this last one she has been bleeding for a month and is still spotting  Pt states she would like a pap smear and to be tested for STDs  Pt also wants us to check her left foot because she fell down some steps about a week ago and since then her left great toe has been bothering her

## 2016-01-09 NOTE — Discharge Instructions (Signed)
The x-ray of your toe today shows no abnormality.   Rest, Ice intermittently (in the first 24-48 hours), Gentle compression with an Ace wrap, and elevate (Limb above the level of the heart)   Take up to  of ibuprofen (that is usually 4 over the counter pills)  3 times a day for 5 days. Take with food.  Do not hesitate to return to the emergency room for any new, worsening or concerning symptoms.  Please obtain primary care using resource guide below. Let them know that you were seen in the emergency room and that they will need to obtain records for further outpatient management.    Abnormal Uterine Bleeding Abnormal uterine bleeding means bleeding from the vagina that is not your normal menstrual period. This can be:  Bleeding or spotting between periods.  Bleeding after sex (sexual intercourse).  Bleeding that is heavier or more than normal.  Periods that last longer than usual.  Bleeding after menopause. There are many problems that may cause this. Treatment will depend on the cause of the bleeding. Any kind of bleeding that is not normal should be reviewed by your doctor.  HOME CARE Watch your condition for any changes. These actions may lessen any discomfort you are having:  Do not use tampons or douches as told by your doctor.  Change your pads often. You should get regular pelvic exams and Pap tests. Keep all appointments for tests as told by your doctor. GET HELP IF:  You are bleeding for more than 1 week.  You feel dizzy at times. GET HELP RIGHT AWAY IF:   You pass out.  You have to change pads every 15 to 30 minutes.  You have belly pain.  You have a fever.  You become sweaty or weak.  You are passing large blood clots from the vagina.  You feel sick to your stomach (nauseous) and throw up (vomit). MAKE SURE YOU:  Understand these instructions.  Will watch your condition.  Will get help right away if you are not doing well or get worse.     This information is not intended to replace advice given to you by your health care provider. Make sure you discuss any questions you have with your health care provider.   Document Released: 05/10/2009 Document Revised: 07/18/2013 Document Reviewed: 02/09/2013 Elsevier Interactive Patient Education 2016 [employer].   [employer] Assistance The [employer] Ways 211 is a great source of information about community services available.  Access by dialing 2-1-1 from anywhere in [place], or by website -  PooledIncome.pl.   Other Local Resources (Updated 07/2015)  Financial Assistance   Services    Phone Number and Address  [facility]  Low-cost medical care - 1st and 3rd Saturday of every month  Must not qualify for public or private insurance and must have limited income [phone] [address] [place], [place]    [employer] of Social Services  Child care  Emergency assistance for housing and [employer]  Medicaid [phone] [address] [place], [place] [address]   [facility] Department  Low-cost medical care for children, communicable diseases, sexually-transmitted diseases, immunizations, maternity care, womens health and family planning [phone] [address] [place], [place] [address]  [facility]   Medication assistance for [facility] residents  Must meet income requirements [phone] [address] [place], [place].    [facility] Social Services  Child  care  Emergency assistance for housing and [employer]  Medicaid [phone] [address] [place], [place] [address]  [facility]   Low-cost medical care,   Monday through Friday, 9 am to 6 pm.   Accepts Medicare/Medicaid, and self-pay [phone] [address] [place], [place] [address]   [facility]  Low-cost medical care - Monday through Friday, 8:30 am - 5:30 pm  Accepts Medicaid and self-pay [phone] [address] [place], [place] [address]   [facility]  Primary medical care, including for those with sickle cell disease  Accepts Medicare, Medicaid, insurance and self-pay [phone] [address] [place], [place]  [facility]   Primary medical care  Accepts Medicare, [place], insurance and self-pay [phone] [address] [place], [place] [address]   [facility] Department of Social Services  Child care  Emergency assistance for housing and [employer]  Medicaid [phone] [address] [place], [place] [address]  [facility] Department of Health and [employer]  Child care  Emergency assistance for housing and [employer]  Medicaid [phone] [address] [place], [place] [address]   [facility] Medication Assistance Program  Medication assistance for [facility] residents with no insurance only  Must have a primary care doctor [phone] [address] [place], [place]  [facility]   Primary medical care  [place], [place], insurance  [phone] [address] [place], [place]  [facility]   Medication assistance [id number]  [facility]   Primary medical care  Accepts Medicare, [place], insurance and self-pay [phone] [address] [place], [place] [address]  [facility]   Primary medical care  Accepts Medicare, [place], insurance and self-pay [phone] [address] [place], [place] [address]  [facility]  For [place] residents between the ages of 85 and 75 who do not have any form of health insurance, Medicare, [place], or [place] benefits.  Services are provided free of  charge to uninsured patients who fall within federal poverty guidelines.    Hours: Tuesdays and Thursdays, 4:15 - 8 pm [phone] [address] [place], [place] [address]  [facility]     Primary medical care  Dental care  Nutritional counseling  Pharmacy  Accepts Medicaid, Medicare, most insurance.  Fees are adjusted based on ability to pay.   [phone] [facility] [address] [place], [place]  [phone] [facility] [address] [place], [place]  [phone] [facility] [place], [place]  [phone] [facility], [address] [place], [place]  [phone] [facility] [address] [place], [place]  [facility]  Womens health and family planning [phone] [address] [place], [place]  [facility] Department of Social Services  Child care  Emergency assistance for housing and [employer]  Medicaid [phone] [address], [place], [place] [address]   [facility]    Ages 60 and older  Hours: Mondays and Thursdays, 7:00 am - 9:00 am Patients are seen on a first come, first served basis. [phone], ext. [phone] [place], [place]  [facility] Division of Social Services  Child care  Emergency assistance for housing and [employer]  Medicaid [phone] [address]  [place], [place] [id number]  [facility] [employer]  Medication assistance  Rental assistance  Food pantry  Medication assistance  Housing assistance  Emergency food distribution  Utility assistance [phone] [address] [place], [place]  [phone]  [address] [place], [place] [phone] Hours: Tuesdays and Thursdays from 9am - 12 noon by appointment only  [phone] [address] [place], [place] [address]  [facility] - Lanae Boastlara [facility]. [facility]   Accepts private insurance, [place], and [place].  Payment is based on a sliding scale for those without insurance.  Hours: Mondays, Tuesdays and Thursdays, 8:30 am - 5:30 pm.   [phone] [address] [place], [place]  [facility] - Family Medicine at [facility]    Accepts private insurance, [place], and [place].  Payment is based on a sliding scale for those without insurance. [phone] [address] [place], [place]  [facility] - Pediatrics at [address] Scientist, research (physical sciences)[place]  Accepts private insurance, [employer]icare, and [place].  Payment is based on a sliding scale for those without insurance [phone] [address], [place], [place]  [facility] - [facility]s at [facility] [facility]  Accepts private insurance, [place], and [place].  Payment is based on a sliding scale for those without insurance. [phone] [address] [place], [place]  [facility] - [facility] at [facility]  Accepts private insurance, [place], and [place].  Payment is based on a sliding scale for those without insurance. [phone], [phone] [place], [place].    [facility]  Maternity care.  Accepts Medicaid and self-pay. [phone] [address] [place], [place]

## 2016-01-10 LAB — GC/CHLAMYDIA PROBE AMP (~~LOC~~) NOT AT ARMC
Chlamydia: NEGATIVE
NEISSERIA GONORRHEA: NEGATIVE

## 2017-04-04 ENCOUNTER — Encounter (HOSPITAL_COMMUNITY): Payer: Self-pay

## 2017-04-04 ENCOUNTER — Emergency Department (HOSPITAL_COMMUNITY)
Admission: EM | Admit: 2017-04-04 | Discharge: 2017-04-04 | Disposition: A | Discharge status: Home / Self Care | Payer: Self-pay | Attending: Emergency Medicine | Admitting: Emergency Medicine

## 2017-04-04 DIAGNOSIS — F17210 Nicotine dependence, cigarettes, uncomplicated: Secondary | ICD-10-CM | POA: Insufficient documentation

## 2017-04-04 DIAGNOSIS — A5901 Trichomonal vulvovaginitis: Secondary | ICD-10-CM | POA: Insufficient documentation

## 2017-04-04 LAB — WET PREP, GENITAL
Clue Cells Wet Prep HPF POC: NONE SEEN
SPERM: NONE SEEN
YEAST WET PREP: NONE SEEN

## 2017-04-04 LAB — POC URINE PREG, ED: PREG TEST UR: NEGATIVE

## 2017-04-04 LAB — URINALYSIS, ROUTINE W REFLEX MICROSCOPIC
Bilirubin Urine: NEGATIVE
GLUCOSE, UA: NEGATIVE mg/dL
HGB URINE DIPSTICK: NEGATIVE
Ketones, ur: 5 mg/dL — AB
LEUKOCYTES UA: NEGATIVE
Nitrite: NEGATIVE
PH: 5 (ref 5.0–8.0)
Protein, ur: NEGATIVE mg/dL
Specific Gravity, Urine: 1.026 (ref 1.005–1.030)

## 2017-04-04 LAB — RPR: RPR Ser Ql: NONREACTIVE

## 2017-04-04 LAB — HIV ANTIBODY (ROUTINE TESTING W REFLEX): HIV Screen 4th Generation wRfx: NONREACTIVE

## 2017-04-04 MED ORDER — CEFTRIAXONE SODIUM 250 MG IJ SOLR
250.0000 mg | Freq: Once | INTRAMUSCULAR | Status: AC
  Administered 2017-04-04: 250 mg via INTRAMUSCULAR
  Filled 2017-04-04: qty 250

## 2017-04-04 MED ORDER — AZITHROMYCIN 250 MG PO TABS
1000.0000 mg | ORAL_TABLET | Freq: Once | ORAL | Status: AC
  Administered 2017-04-04: 1000 mg via ORAL
  Filled 2017-04-04: qty 4

## 2017-04-04 MED ORDER — STERILE WATER FOR INJECTION IJ SOLN
INTRAMUSCULAR | Status: AC
  Administered 2017-04-04: 0.9 mL
  Filled 2017-04-04: qty 10

## 2017-04-04 MED ORDER — METRONIDAZOLE 500 MG PO TABS
2000.0000 mg | ORAL_TABLET | Freq: Once | ORAL | Status: AC
  Administered 2017-04-04: 2000 mg via ORAL
  Filled 2017-04-04: qty 4

## 2017-04-04 MED ORDER — ONDANSETRON 8 MG PO TBDP
8.0000 mg | ORAL_TABLET | Freq: Once | ORAL | Status: AC
  Administered 2017-04-04: 8 mg via ORAL
  Filled 2017-04-04: qty 1

## 2017-04-04 NOTE — Discharge Instructions (Signed)
Do not engage in sexual intercourse for one week. We advise that you notify all sexual partners of their need to be tested and treated for STDs. You may return to the emergency department, as needed, for new or concerning symptoms.

## 2017-04-04 NOTE — ED Triage Notes (Signed)
Pt complains of a thick white discharge for a week, no odor but it itches Pt used Darene LamerNair and the burning never went away

## 2017-04-04 NOTE — ED Provider Notes (Signed)
[facility]-EMERGENC[facility] DEPT Provider Note   CSN: [id number] Arrival date & time: 04/04/17  0428     History   Chief Complaint Chief Complaint  Patient presents with  . Vaginal Discharge    HPI Shannon Rivas is a 31 y.o. female.  Reports sexual activity with 1 female partner, but relationship recently ended and patient expresses concern about potential partner promiscuity.    The history is provided by the patient. No language interpreter was used.  Vaginal Discharge   This is a new problem. Episode onset: 1 week ago. The problem occurs constantly. The problem has been gradually worsening. The discharge occurs spontaneously. The discharge was white. She is not pregnant. She has not missed her period. Pertinent negatives include no fever, no abdominal pain, no vomiting, no dysuria and no frequency. She has tried nothing for the symptoms. Her past medical history does not include STD.    Past Medical History:  Diagnosis Date  . Obesity     There are no active problems to display for this patient.   History reviewed. No pertinent surgical history.  OB History    No data available       Home Medications    Prior to Admission medications   Medication Sig Start Date End Date Taking? Authorizing Provider  BIOTIN PO Take 1 tablet by mouth daily.    [provider]  diazepam (VALIUM) 5 MG tablet Take 1 tablet (5 mg total) by mouth every 12 (twelve) hours as needed for anxiety. Patient not taking: Reported on 01/09/2016 02/04/14   Roxy Horseman, PA-C  diazepam (VALIUM) 5 MG tablet Take 1 tablet (5 mg total) by mouth every 12 (twelve) hours as needed for muscle spasms (and pain). Patient not taking: Reported on 01/09/2016 02/12/14   Street, [place], PA-C  HYDROcodone-acetaminophen (NORCO) 5-325 MG per tablet Take 1-2 tablets by mouth every 6 (six) hours as needed for severe pain. Patient not taking: Reported on 01/09/2016 02/12/14   Street, [place], PA-C    Family  History Family History  Problem Relation Age of Onset  . Diabetes Mother   . Hypertension Mother   . Thyroid disease Mother   . Ulcers Father     Social History Social History  Substance Use Topics  . Smoking status: Current Every Day Smoker    Types: Cigarettes  . Smokeless tobacco: Never Used  . Alcohol use Yes     Comment: socially      Allergies   Patient has no known allergies.   Review of Systems Review of Systems  Constitutional: Negative for fever.  Gastrointestinal: Negative for abdominal pain and vomiting.  Genitourinary: Positive for vaginal discharge. Negative for dysuria and frequency.  Ten systems reviewed and are negative for acute change, except as noted in the HPI.    Physical Exam Updated Vital Signs BP (!) 140/98 (BP Location: Left Arm)   Pulse 77   Temp 98.1 F (36.7 C) (Oral)   Resp 20   Ht  (1.6 m)   Wt 103.9 kg (229 lb)   LMP 03/16/2017   SpO2 98%   BMI 40.57 kg/m   Physical Exam  Constitutional: She is oriented to person, place, and time. She appears well-developed and well-nourished. No distress.  Nontoxic appearing and pleasant.  HENT:  Head: Normocephalic and atraumatic.  Eyes: Conjunctivae and EOM are normal. No scleral icterus.  Neck: Normal range of motion.  Pulmonary/Chest: Effort normal. No respiratory distress.  Genitourinary:  Genitourinary Comments: Scant white  discharge. No CMT. No external changes to genitalia.  Musculoskeletal: Normal range of motion.  Neurological: She is alert and oriented to person, place, and time.  Skin: Skin is warm and dry. No rash noted. She is not diaphoretic. No erythema. No pallor.  Psychiatric: She has a normal mood and affect. Her behavior is normal.  Nursing note and vitals reviewed.    ED Treatments / Results  Labs (all labs ordered are listed, but only abnormal results are displayed) Labs Reviewed  WET PREP, GENITAL - Abnormal; Notable for the following:       Result Value    Trich, Wet Prep PRESENT (*)    WBC, Wet Prep HPF POC MANY (*)    All other components within normal limits  URINALYSIS, ROUTINE W REFLEX MICROSCOPIC - Abnormal; Notable for the following:    Ketones, ur 5 (*)    All other components within normal limits  HIV ANTIBODY (ROUTINE TESTING)  RPR  POC URINE PREG, ED  GC/CHLAMYDIA PROBE AMP ([place]) NOT AT [facility]    EKG  EKG Interpretation None       Radiology No results found.  Procedures Procedures (including critical care time)  Medications Ordered in ED Medications  ondansetron (ZOFRAN-ODT) disintegrating tablet 8 mg (8 mg Oral Given 04/04/17 0633)  metroNIDAZOLE (FLAGYL) tablet 2,000 mg (2,000 mg Oral Given 04/04/17 0634)  cefTRIAXone (ROCEPHIN) injection 250 mg (250 mg Intramuscular Given 04/04/17 0635)  azithromycin (ZITHROMAX) tablet 1,000 mg (1,000 mg Oral Given 04/04/17 [id number])  sterile water (preservative free) injection (0.9 mLs  Given 04/04/17 [id number])     Initial Impression / Assessment and Plan / ED Course  I have reviewed the triage vital signs and the nursing notes.  Pertinent labs & imaging results that were available during my care of the patient were reviewed by me and considered in my medical decision making (see chart for details).     Patient to be discharged with instructions to follow up with the Health Department regarding her pending STD tests. She presents for vaginal discharge and wet prep reveals +Trichomoniasis and WBCs. Patient understands that she has GC/Chlamydia cultures pending and that she will need to inform all sexual partners of their need to be tested and treated for STDs. Patient has been treated prophylacticly with azithromycin and rocephin today. 2g Flagyl also given for Trichomonas. Return precautions discussed and provided. Patient discharged in stable condition with no unaddressed concerns.   Final Clinical Impressions(s) / ED Diagnoses   Final diagnoses:  Trichomonal vaginitis    New  Prescriptions New Prescriptions   No medications on file     Antony MaduraHumes, Elysia [place], PA-C 04/04/17 0640    Melene PlanFloyd, Dan, DO 04/04/17 [phone]

## 2017-04-05 LAB — GC/CHLAMYDIA PROBE AMP (~~LOC~~) NOT AT ARMC
CHLAMYDIA, DNA PROBE: NEGATIVE
NEISSERIA GONORRHEA: NEGATIVE

## 2017-05-29 ENCOUNTER — Encounter (HOSPITAL_COMMUNITY): Payer: Self-pay | Admitting: Emergency Medicine

## 2017-05-29 ENCOUNTER — Emergency Department (HOSPITAL_COMMUNITY)
Admission: EM | Admit: 2017-05-29 | Discharge: 2017-05-29 | Disposition: A | Discharge status: Home / Self Care | Payer: Self-pay | Attending: Emergency Medicine | Admitting: Emergency Medicine

## 2017-05-29 DIAGNOSIS — F17210 Nicotine dependence, cigarettes, uncomplicated: Secondary | ICD-10-CM | POA: Insufficient documentation

## 2017-05-29 DIAGNOSIS — N72 Inflammatory disease of cervix uteri: Secondary | ICD-10-CM | POA: Insufficient documentation

## 2017-05-29 DIAGNOSIS — N760 Acute vaginitis: Secondary | ICD-10-CM | POA: Insufficient documentation

## 2017-05-29 DIAGNOSIS — N812 Incomplete uterovaginal prolapse: Secondary | ICD-10-CM | POA: Insufficient documentation

## 2017-05-29 DIAGNOSIS — B9689 Other specified bacterial agents as the cause of diseases classified elsewhere: Secondary | ICD-10-CM

## 2017-05-29 LAB — URINALYSIS, ROUTINE W REFLEX MICROSCOPIC
BILIRUBIN URINE: NEGATIVE
GLUCOSE, UA: NEGATIVE mg/dL
Hgb urine dipstick: NEGATIVE
KETONES UR: NEGATIVE mg/dL
LEUKOCYTES UA: NEGATIVE
NITRITE: NEGATIVE
PH: 6 (ref 5.0–8.0)
PROTEIN: NEGATIVE mg/dL
Specific Gravity, Urine: 1.021 (ref 1.005–1.030)

## 2017-05-29 LAB — WET PREP, GENITAL
SPERM: NONE SEEN
Trich, Wet Prep: NONE SEEN
YEAST WET PREP: NONE SEEN

## 2017-05-29 LAB — PREGNANCY, URINE: PREG TEST UR: NEGATIVE

## 2017-05-29 MED ORDER — AZITHROMYCIN 250 MG PO TABS
1000.0000 mg | ORAL_TABLET | Freq: Once | ORAL | Status: AC
  Administered 2017-05-29: 1000 mg via ORAL
  Filled 2017-05-29: qty 4

## 2017-05-29 MED ORDER — CEFTRIAXONE SODIUM 250 MG IJ SOLR
250.0000 mg | Freq: Once | INTRAMUSCULAR | Status: AC
  Administered 2017-05-29: 250 mg via INTRAMUSCULAR
  Filled 2017-05-29: qty 250

## 2017-05-29 MED ORDER — LIDOCAINE HCL (PF) 1 % IJ SOLN
INTRAMUSCULAR | Status: AC
  Administered 2017-05-29: 12:00:00
  Filled 2017-05-29: qty 5

## 2017-05-29 MED ORDER — METRONIDAZOLE 500 MG PO TABS: 500.0000 mg | ORAL_TABLET | Freq: Two times a day (BID) | ORAL | 0 refills | Status: DC

## 2017-05-29 NOTE — ED Notes (Signed)
Bed: WTR8 Expected date:  Expected time:  Means of arrival:  Comments: 

## 2017-05-29 NOTE — ED Triage Notes (Signed)
Patient here with complaints of vaginal irritation. Reports that she was seen and treated for trich and was suppose to follow up with the health department but did not.

## 2017-05-29 NOTE — ED Notes (Signed)
Pt dx 9/9 with trichomoniasis and given medication to treat it. She was supposed to follow up with the health department about the issue, but was unable. She returns today for continuation of the same symptoms, no new symptoms.

## 2017-05-29 NOTE — Discharge Instructions (Signed)
You have a condition called bacterial vaginosis.  Information sheet given.  Also, you have a prolapsed cervix.  You will need follow-up at [facility] or the health department for GYN issues.  Your sexual partner should be treated.

## 2017-05-29 NOTE — ED Provider Notes (Addendum)
[facility]-EMERGENCY DEPT Provider Note   CSN: [id number] Arrival date & time: 05/29/17  1324     History   Chief Complaint Chief Complaint  Patient presents with  . Vaginal Discharge  . Vaginal Itching    HPI Shannon Rivas is a 31 y.o. female.  Vaginal discharge described as white and itchy for unknown period of time.  She was treated in September for trichomonas.  She is sexually active with one partner but uses no protection.  No fever, chills, dysuria, ulceration.  She does complain of irritation on the external genitalia.  Severity of symptoms is mild.  She has had no follow-up since the previous visit.      Past Medical History:  Diagnosis Date  . Obesity     There are no active problems to display for this patient.   History reviewed. No pertinent surgical history.  OB History    No data available       Home Medications    Prior to Admission medications   Medication Sig Start Date End Date Taking? Authorizing Provider  metroNIDAZOLE (FLAGYL) 500 MG tablet Take 1 tablet (500 mg total) by mouth 2 (two) times daily. 05/29/17   Donnetta Hutching, MD    Family History Family History  Problem Relation Age of Onset  . Diabetes Mother   . Hypertension Mother   . Thyroid disease Mother   . Ulcers Father     Social History Social History  Substance Use Topics  . Smoking status: Current Every Day Smoker    Types: Cigarettes  . Smokeless tobacco: Never Used  . Alcohol use Yes     Comment: socially      Allergies   Patient has no known allergies.   Review of Systems Review of Systems  All other systems reviewed and are negative.    Physical Exam Updated Vital Signs BP 130/77 (BP Location: Left Arm)   Pulse 66   Temp 98.3 F (36.8 C) (Oral)   Resp 16   Ht 5\' 3"  (1.6 m)   Wt 103.9 kg (229 lb)   SpO2 100%   BMI 40.57 kg/m   Physical Exam  Constitutional: She is oriented to person, place, and time. She appears  well-developed and well-nourished.  HENT:  Head: Normocephalic and atraumatic.  Eyes: Conjunctivae are normal.  Neck: Neck supple.  Cardiovascular: Normal rate and regular rhythm.   Pulmonary/Chest: Effort normal and breath sounds normal.  Abdominal: Soft. Bowel sounds are normal.  Genitourinary:  Genitourinary Comments: Genital exam: No obvious ulceration or rash on the external genitalia.  Minimal cervical motion tenderness.  There is a small amount whitish discharge.  No adnexal tenderness.  There was evidence of a prolapsed cervix when patient bore down.  Musculoskeletal: Normal range of motion.  Neurological: She is alert and oriented to person, place, and time.  Skin: Skin is warm and dry.  Psychiatric: She has a normal mood and affect. Her behavior is normal.  Nursing note and vitals reviewed.    ED Treatments / Results  Labs (all labs ordered are listed, but only abnormal results are displayed) Labs Reviewed  WET PREP, GENITAL - Abnormal; Notable for the following:       Result Value   Clue Cells Wet Prep HPF POC PRESENT (*)    WBC, Wet Prep HPF POC MANY (*)    All other components within normal limits  PREGNANCY, URINE  URINALYSIS, ROUTINE W REFLEX MICROSCOPIC  HIV ANTIBODY (ROUTINE  TESTING)  RPR  GC/CHLAMYDIA PROBE AMP ([place]) NOT [facility]    EKG  EKG Interpretation None       Radiology No results found.  Procedures Procedures (including critical care time)  Medications Ordered in ED Medications  cefTRIAXone (ROCEPHIN) injection 250 mg (250 mg Intramuscular Given 05/29/17 1124)  azithromycin (ZITHROMAX) tablet 1,000 mg (1,000 mg Oral Given 05/29/17 1124)  lidocaine (PF) (XYLOCAINE) 1 % injection (  Given 05/29/17 1134)     Initial Impression / Assessment and Plan / ED Course  I have reviewed the triage vital signs and the nursing notes.  Pertinent labs & imaging results that were available during my care of the patient were reviewed by me and  considered in my medical decision making (see chart for details).     No acute abdomen.  She may have a prolapsed cervix.  Will Rx for cervicitis with Rocephin 250 mg IM and Zithromax 1 g orally.  Discharge medication Flagyl 500 mg twice a day for 1 week for BV.  Will refer to GYN for further evaluation.  HIV and RPR pending.  Final Clinical Impressions(s) / ED Diagnoses   Final diagnoses:  Cervix prolapsed into vagina  BV (bacterial vaginosis)  Cervicitis    New Prescriptions New Prescriptions   METRONIDAZOLE (FLAGYL) 500 MG TABLET    Take 1 tablet (500 mg total) by mouth 2 (two) times daily.     Donnetta Hutchingook, Kerrilyn Azbill, MD 05/29/17 [id number]    Donnetta Hutchingook, Daiya Tamer, MD 05/30/17 [phone]

## 2017-05-30 LAB — HIV ANTIBODY (ROUTINE TESTING W REFLEX): HIV Screen 4th Generation wRfx: NONREACTIVE

## 2017-05-30 LAB — RPR: RPR Ser Ql: NONREACTIVE

## 2017-05-31 LAB — GC/CHLAMYDIA PROBE AMP (~~LOC~~) NOT AT ARMC
CHLAMYDIA, DNA PROBE: NEGATIVE
NEISSERIA GONORRHEA: NEGATIVE

## 2020-02-08 ENCOUNTER — Other Ambulatory Visit: Payer: Self-pay

## 2020-02-08 ENCOUNTER — Inpatient Hospital Stay (HOSPITAL_COMMUNITY)
Admission: EM | Admit: 2020-02-08 | Discharge: 2020-02-08 | Disposition: A | Discharge status: Home / Self Care | Payer: Self-pay | Attending: *Deleted | Admitting: *Deleted

## 2020-02-08 ENCOUNTER — Encounter (HOSPITAL_COMMUNITY): Payer: Self-pay | Admitting: Obstetrics and Gynecology

## 2020-02-08 DIAGNOSIS — Z3A11 11 weeks gestation of pregnancy: Secondary | ICD-10-CM

## 2020-02-08 DIAGNOSIS — O26891 Other specified pregnancy related conditions, first trimester: Secondary | ICD-10-CM

## 2020-02-08 DIAGNOSIS — R1030 Lower abdominal pain, unspecified: Secondary | ICD-10-CM

## 2020-02-08 DIAGNOSIS — Z3491 Encounter for supervision of normal pregnancy, unspecified, first trimester: Secondary | ICD-10-CM

## 2020-02-08 DIAGNOSIS — O99331 Smoking (tobacco) complicating pregnancy, first trimester: Secondary | ICD-10-CM | POA: Insufficient documentation

## 2020-02-08 DIAGNOSIS — E669 Obesity, unspecified: Secondary | ICD-10-CM | POA: Insufficient documentation

## 2020-02-08 DIAGNOSIS — F17210 Nicotine dependence, cigarettes, uncomplicated: Secondary | ICD-10-CM | POA: Insufficient documentation

## 2020-02-08 DIAGNOSIS — O99211 Obesity complicating pregnancy, first trimester: Secondary | ICD-10-CM | POA: Insufficient documentation

## 2020-02-08 LAB — COMPREHENSIVE METABOLIC PANEL
ALT: 12 U/L (ref 0–44)
AST: 14 U/L — ABNORMAL LOW (ref 15–41)
Albumin: 2.7 g/dL — ABNORMAL LOW (ref 3.5–5.0)
Alkaline Phosphatase: 66 U/L (ref 38–126)
Anion gap: 6 (ref 5–15)
BUN: 8 mg/dL (ref 6–20)
CO2: 22 mmol/L (ref 22–32)
Calcium: 8.8 mg/dL — ABNORMAL LOW (ref 8.9–10.3)
Chloride: 107 mmol/L (ref 98–111)
Creatinine, Ser: 0.69 mg/dL (ref 0.44–1.00)
GFR calc Af Amer: >60 mL/min (ref >60)
GFR calc non Af Amer: >60 mL/min (ref >60)
Glucose, Bld: 94 mg/dL (ref 70–99)
Potassium: 3.6 mmol/L (ref 3.5–5.1)
Sodium: 135 mmol/L (ref 135–145)
Total Bilirubin: 0.3 mg/dL (ref 0.3–1.2)
Total Protein: 6.5 g/dL (ref 6.5–8.1)

## 2020-02-08 LAB — URINALYSIS, ROUTINE W REFLEX MICROSCOPIC
Bilirubin Urine: NEGATIVE
Glucose, UA: NEGATIVE mg/dL
Hgb urine dipstick: NEGATIVE
Ketones, ur: NEGATIVE mg/dL
Nitrite: NEGATIVE
Protein, ur: NEGATIVE mg/dL
Specific Gravity, Urine: 1.032 — ABNORMAL HIGH (ref 1.005–1.030)
pH: 6 (ref 5.0–8.0)

## 2020-02-08 LAB — I-STAT BETA HCG BLOOD, ED (MC, WL, AP ONLY): I-stat hCG, quantitative: >2000 m[IU]/mL — ABNORMAL HIGH (ref <5)

## 2020-02-08 LAB — CBC
HCT: 37.2 % (ref 36.0–46.0)
Hemoglobin: 11.8 g/dL — ABNORMAL LOW (ref 12.0–15.0)
MCH: 27 pg (ref 26.0–34.0)
MCHC: 31.7 g/dL (ref 30.0–36.0)
MCV: 85.1 fL (ref 80.0–100.0)
Platelets: 251 10*3/uL (ref 150–400)
RBC: 4.37 MIL/uL (ref 3.87–5.11)
RDW: 13.1 % (ref 11.5–15.5)
WBC: 8.5 10*3/uL (ref 4.0–10.5)
nRBC: 0 % (ref 0.0–0.2)

## 2020-02-08 LAB — HCG, QUANTITATIVE, PREGNANCY: hCG, Beta Chain, Quant, S: 155019 m[IU]/mL — ABNORMAL HIGH (ref <5)

## 2020-02-08 MED ORDER — PREPLUS 27-1 MG PO TABS: 1.0000 | ORAL_TABLET | Freq: Every day | ORAL | 13 refills | Status: AC

## 2020-02-08 NOTE — MAU Note (Signed)
Pt stated she has been having back and pelvic pain x 1.5 weeks. 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 Stated she is still having nausea with some vomiting. Trying some ginger and lemon drops without relief.

## 2020-02-08 NOTE — Discharge Instructions (Signed)
First Trimester of Pregnancy  The first trimester of pregnancy is from week 1 until the end of week 13 (months 1 through 3). During this time, your baby will begin to develop inside you. At 6-8 weeks, the eyes and face are formed, and the heartbeat can be seen on ultrasound. At the end of 12 weeks, all the baby's organs are formed. Prenatal care is all the medical care you receive before the birth of your baby. Make sure you get good prenatal care and follow all of your doctor's instructions. Follow these instructions at home: Medicines  Take over-the-counter and prescription medicines only as told by your doctor. Some medicines are safe and some medicines are not safe during pregnancy.  Take a prenatal vitamin that contains at least 600 micrograms (mcg) of folic acid.  If you have trouble pooping (constipation), take medicine that will make your stool soft (stool softener) if your doctor approves. Eating and drinking   Eat regular, healthy meals.  Your doctor will tell you the amount of weight gain that is right for you.  Avoid raw meat and uncooked cheese.  If you feel sick to your stomach (nauseous) or throw up (vomit): ? Eat 4 or 5 small meals a day instead of 3 large meals. ? Try eating a few soda crackers. ? Drink liquids between meals instead of during meals.  To prevent constipation: ? Eat foods that are high in fiber, like fresh fruits and vegetables, whole grains, and beans. ? Drink enough fluids to keep your pee (urine) clear or pale yellow. Activity  Exercise only as told by your doctor. Stop exercising if you have cramps or pain in your lower belly (abdomen) or low back.  Do not exercise if it is too hot, too humid, or if you are in a place of great height (high altitude).  Try to avoid standing for long periods of time. Move your legs often if you must stand in one place for a long time.  Avoid heavy lifting.  Wear low-heeled shoes. Sit and stand up  straight.  You can have sex unless your doctor tells you not to. Relieving pain and discomfort  Wear a good support bra if your breasts are sore.  Take warm water baths (sitz baths) to soothe pain or discomfort caused by hemorrhoids. Use hemorrhoid cream if your doctor says it is okay.  Rest with your legs raised if you have leg cramps or low back pain.  If you have puffy, bulging veins (varicose veins) in your legs: ? Wear support hose or compression stockings as told by your doctor. ? Raise (elevate) your feet for 15 minutes, 3-4 times a day. ? Limit salt in your food. Prenatal care  Schedule your prenatal visits by the twelfth week of pregnancy.  Write down your questions. Take them to your prenatal visits.  Keep all your prenatal visits as told by your doctor. This is important. Safety  Wear your seat belt at all times when driving.  Make a list of emergency phone numbers. The list should include numbers for family, friends, the hospital, and police and fire departments. General instructions  Ask your doctor for a referral to a local prenatal class. Begin classes no later than at the start of month 6 of your pregnancy.  Ask for help if you need counseling or if you need help with nutrition. Your doctor can give you advice or tell you where to go for help.  Do not use hot tubs, steam   rooms, or saunas.  Do not douche or use tampons or scented sanitary pads.  Do not cross your legs for long periods of time.  Avoid all herbs and alcohol. Avoid drugs that are not approved by your doctor.  Do not use any tobacco products, including cigarettes, chewing tobacco, and electronic cigarettes. If you need help quitting, ask your doctor. You may get counseling or other support to help you quit.  Avoid cat litter boxes and soil used by cats. These carry germs that can cause birth defects in the baby and can cause a loss of your baby (miscarriage) or stillbirth.  Visit your dentist.  At home, brush your teeth with a soft toothbrush. Be gentle when you floss. Contact a doctor if:  You are dizzy.  You have mild cramps or pressure in your lower belly.  You have a nagging pain in your belly area.  You continue to feel sick to your stomach, you throw up, or you have watery poop (diarrhea).  You have a bad smelling fluid coming from your vagina.  You have pain when you pee (urinate).  You have increased puffiness (swelling) in your face, hands, legs, or ankles. Get help right away if:  You have a fever.  You are leaking fluid from your vagina.  You have spotting or bleeding from your vagina.  You have very bad belly cramping or pain.  You gain or lose weight rapidly.  You throw up blood. It may look like coffee grounds.  You are around people who have German measles, fifth disease, or chickenpox.  You have a very bad headache.  You have shortness of breath.  You have any kind of trauma, such as from a fall or a car accident. Summary  The first trimester of pregnancy is from week 1 until the end of week 13 (months 1 through 3).  To take care of yourself and your unborn baby, you will need to eat healthy meals, take medicines only if your doctor tells you to do so, and do activities that are safe for you and your baby.  Keep all follow-up visits as told by your doctor. This is important as your doctor will have to ensure that your baby is healthy and growing well. This information is not intended to replace advice given to you by your health care provider. Make sure you discuss any questions you have with your health care provider. Document Revised: 11/03/2018 Document Reviewed: 07/21/2016 Elsevier Patient Education  2020 Elsevier Inc.  Safe Medications in Pregnancy   Acne: Benzoyl Peroxide Salicylic Acid  Backache/Headache: Tylenol: 2 regular strength every 4 hours OR              2 Extra strength every 6  hours  Colds/Coughs/Allergies: Benadryl (alcohol free) 25 mg every 6 hours as needed Breath right strips Claritin Cepacol throat lozenges Chloraseptic throat spray Cold-Eeze- up to three times per day Cough drops, alcohol free Flonase (by prescription only) Guaifenesin Mucinex Robitussin DM (plain only, alcohol free) Saline nasal spray/drops Sudafed (pseudoephedrine) & Actifed ** use only after [redacted] weeks gestation and if you do not have high blood pressure Tylenol Vicks Vaporub Zinc lozenges Zyrtec   Constipation: Colace Ducolax suppositories Fleet enema Glycerin suppositories Metamucil Milk of magnesia Miralax Senokot Smooth move tea  Diarrhea: Kaopectate Imodium A-D  *NO pepto Bismol  Hemorrhoids: Anusol Anusol HC Preparation H Tucks  Indigestion: Tums Maalox Mylanta Zantac  Pepcid  Insomnia: Benadryl (alcohol free) 25mg every 6 hours as needed   Tylenol PM Unisom, no Gelcaps  Leg Cramps: Tums MagGel  Nausea/Vomiting:  Bonine Dramamine Emetrol Ginger extract Sea bands Meclizine  Nausea medication to take during pregnancy:  Unisom (doxylamine succinate 25 mg tablets) Take one tablet daily at bedtime. If symptoms are not adequately controlled, the dose can be increased to a maximum recommended dose of two tablets daily (1/2 tablet in the morning, 1/2 tablet mid-afternoon and one at bedtime). Vitamin B6 100mg tablets. Take one tablet twice a day (up to 200 mg per day).  Skin Rashes: Aveeno products Benadryl cream or 25mg every 6 hours as needed Calamine Lotion 1% cortisone cream  Yeast infection: Gyne-lotrimin 7 Monistat 7   **If taking multiple medications, please check labels to avoid duplicating the same active ingredients **take medication as directed on the label ** Do not exceed 4000 mg of tylenol in 24 hours **Do not take medications that contain aspirin or ibuprofen   

## 2020-02-08 NOTE — ED Notes (Signed)
Report called to [facility] in MAU.

## 2020-02-08 NOTE — ED Notes (Signed)
MSE by PA at triage for MAU

## 2020-02-08 NOTE — ED Provider Notes (Signed)
Patient placed in Quick Look pathway, seen and evaluated   Chief Complaint: abdominal and pelvic pain  HPI:   34 year old female G2P1 estimating [redacted] weeks gestation presented to emergency room today with chief complaint of abdominal pain and pelvic pain x2 weeks.  She states the pain has significantly worsened over the last x2 days.  She states the pain is intermittent.  She is currently waiting for her health insurance to take effect and has not establish care with an OB.  She did however have an ultrasound at a free pregnancy clinic that confirmed IUP.  She states she has had morning sickness throughout pregnancy that is getting worse and not better as she would expect.   ROS: + abdominal pain, + pelvic pain, -vaginal bleeding, -vaginal discharge  Physical Exam:   Gen: No distress  Neuro: Awake and Alert  Skin: Warm    Focused Exam: Tenderness to bilateral lower quadrants that peritoneal signs.  No rigidity or guarding.   Results for orders placed or performed during the hospital encounter of 02/08/20 (from the past 24 hour(s))  I-Stat beta hCG blood, ED     Status: Abnormal   Collection Time: 02/08/20 12:55 PM  Result Value Ref Range   I-stat hCG, quantitative >2,000.0 (H) <5 mIU/mL   Comment 3             Initiation of care has begun. Beta quant collected and >2k.  The patient has been counseled on the process, plan, and necessity for staying for the completion/evaluation, and the remainder of the medical screening examination  Discussed with MAU APP Denny Peon who accepts patient in transfer for work up.   Portions of this note were generated with Scientist, clinical (histocompatibility and immunogenetics). Dictation errors may occur despite best attempts at proofreading.    Kathyrn Lass 02/08/20 1310    Sabas Sous, MD 02/08/20 1743

## 2020-02-08 NOTE — MAU Provider Note (Signed)
History     CSN: [id number]  Arrival date and time: 02/08/20 1237   First Provider Initiated Contact with Patient 02/08/20 1621      Chief Complaint  Patient presents with  . Abdominal Pain   HPI Shannon Rivas is a 34 y.o. G1P0 at [redacted]w[redacted]d who presents to MAU from [facility] for evaluation of recurrent lower abdominal pain. She first noticed the pain about one week ago and it has continued at odd times throughout the day since that time. She denies abdominal tenderness, vaginal bleeding, dysuria, fever or recent illness. She is remote from sexual intercourse.   Patient previously had pregnancy confirmation and ultrasound confirmation of IUP at an outside clinic.   OB History    Gravida  1   Para      Term      Preterm      AB      Living        SAB      TAB      Ectopic      Multiple      Live Births              Past Medical History:  Diagnosis Date  . Obesity     No past surgical history on file.  Family History  Problem Relation Age of Onset  . Diabetes Mother   . Hypertension Mother   . Thyroid disease Mother   . Ulcers Father     Social History   Tobacco Use  . Smoking status: Current Every Day Smoker    Types: Cigarettes  . Smokeless tobacco: Never Used  Substance Use Topics  . Alcohol use: Yes    Comment: socially   . Drug use: No    Allergies: No Known Allergies  Medications Prior to Admission  Medication Sig Dispense Refill Last Dose  . metroNIDAZOLE (FLAGYL) 500 MG tablet Take 1 tablet (500 mg total) by mouth 2 (two) times daily. 14 tablet 0     Review of Systems  Gastrointestinal: Positive for abdominal pain.  All other systems reviewed and are negative.  Physical Exam   Blood pressure 130/68, pulse 81, temperature 99 F (37.2 C), resp. rate 18, height 5\' 3"  (1.6 m), weight 103.9 kg, last menstrual period 11/22/2019, SpO2 99 %.  Physical Exam Vitals and nursing note reviewed. Exam conducted with a chaperone present.   Cardiovascular:     Rate and Rhythm: Normal rate.     Heart sounds: Normal heart sounds.  Pulmonary:     Effort: Pulmonary effort is normal.     Breath sounds: Normal breath sounds.  Abdominal:     General: Abdomen is flat.     Tenderness: There is no abdominal tenderness.  Genitourinary:    Comments: Deferred due to absence of vaginal bleeding Skin:    General: Skin is warm and dry.     Capillary Refill: Capillary refill takes less than 2 seconds.  Neurological:     Mental Status: She is alert.  Psychiatric:        Mood and Affect: Mood normal.        Behavior: Behavior normal.     MAU Course/MDM  Procedures   --Bedside Ultrasound for FHR check: FHT 150 bpm, very active fetal movement noted Patient informed that the ultrasound is considered a limited obstetric ultrasound and is not intended to be a complete ultrasound exam.  Patient also informed that the ultrasound is not being completed with the intent of  assessing for fetal or placental anomalies or any pelvic abnormalities.  Explained that the purpose of today's ultrasound is to assess for fetal heart rate.  Patient acknowledges the purpose of the exam and the limitations of the study.     Orders Placed This Encounter  Procedures  . Urinalysis, Routine w reflex microscopic  . CBC  . hCG, quantitative, pregnancy  . Comprehensive metabolic panel  . I-Stat beta hCG blood, ED   Results for orders placed or performed during the hospital encounter of 02/08/20 (from the past 24 hour(s))  I-Stat beta hCG blood, ED     Status: Abnormal   Collection Time: 02/08/20 12:55 PM  Result Value Ref Range   I-stat hCG, quantitative >2,000.0 (H) <5 mIU/mL   Comment 3          Urinalysis, Routine w reflex microscopic     Status: Abnormal   Collection Time: 02/08/20  4:25 PM  Result Value Ref Range   Color, Urine YELLOW YELLOW   APPearance CLEAR CLEAR   Specific Gravity, Urine 1.032 (H) 1.005 - 1.030   pH 6.0 5.0 - 8.0   Glucose,  UA NEGATIVE NEGATIVE mg/dL   Hgb urine dipstick NEGATIVE NEGATIVE   Bilirubin Urine NEGATIVE NEGATIVE   Ketones, ur NEGATIVE NEGATIVE mg/dL   Protein, ur NEGATIVE NEGATIVE mg/dL   Nitrite NEGATIVE NEGATIVE   Leukocytes,Ua TRACE (A) NEGATIVE   RBC / HPF 0-5 0 - 5 RBC/hpf   WBC, UA 6-10 0 - 5 WBC/hpf   Bacteria, UA RARE (A) NONE SEEN   Squamous Epithelial / LPF 0-5 0 - 5   Mucus PRESENT   CBC     Status: Abnormal   Collection Time: 02/08/20  4:25 PM  Result Value Ref Range   WBC 8.5 4.0 - 10.5 K/uL   RBC 4.37 3.87 - 5.11 MIL/uL   Hemoglobin 11.8 (L) 12.0 - 15.0 g/dL   HCT 31.4 36 - 46 %   MCV 85.1 80.0 - 100.0 fL   MCH 27.0 26.0 - 34.0 pg   MCHC 31.7 30.0 - 36.0 g/dL   RDW 97.0 26.3 - 78.5 %   Platelets 251 150 - 400 K/uL   nRBC 0.0 0.0 - 0.2 %  hCG, quantitative, pregnancy     Status: Abnormal   Collection Time: 02/08/20  4:25 PM  Result Value Ref Range   hCG, Beta Chain, Quant, S 155,019 (H) <5 mIU/mL  Comprehensive metabolic panel     Status: Abnormal   Collection Time: 02/08/20  4:25 PM  Result Value Ref Range   Sodium 135 135 - 145 mmol/L   Potassium 3.6 3.5 - 5.1 mmol/L   Chloride 107 98 - 111 mmol/L   CO2 22 22 - 32 mmol/L   Glucose, Bld 94 70 - 99 mg/dL   BUN 8 6 - 20 mg/dL   Creatinine, Ser 8.85 0.44 - 1.00 mg/dL   Calcium 8.8 (L) 8.9 - 10.3 mg/dL   Total Protein 6.5 6.5 - 8.1 g/dL   Albumin 2.7 (L) 3.5 - 5.0 g/dL   AST 14 (L) 15 - 41 U/L   ALT 12 0 - 44 U/L   Alkaline Phosphatase 66 38 - 126 U/L   Total Bilirubin 0.3 0.3 - 1.2 mg/dL   GFR calc non Af Amer >60 >60 mL/min   GFR calc Af Amer >60 >60 mL/min   Anion gap 6 5 - 15   Assessment and Plan  --34 y.o. G1P0 at [redacted]w[redacted]d  --+  FHT on BSUS --Tylenol 650 mg q 4 PRN for abdominal pain --Discharge home in stable condition  Calvert Cantor, CNM 02/08/2020, 7:29 PM

## 2020-02-10 LAB — CULTURE, OB URINE: Culture: NO GROWTH

## 2020-04-16 ENCOUNTER — Ambulatory Visit (INDEPENDENT_AMBULATORY_CARE_PROVIDER_SITE_OTHER): Payer: Self-pay | Admitting: *Deleted

## 2020-04-16 DIAGNOSIS — O0930 Supervision of pregnancy with insufficient antenatal care, unspecified trimester: Secondary | ICD-10-CM

## 2020-04-16 DIAGNOSIS — Z3480 Encounter for supervision of other normal pregnancy, unspecified trimester: Secondary | ICD-10-CM

## 2020-04-16 MED ORDER — PRENATE PIXIE 10-0.6-0.4-200 MG PO CAPS: 1.0000 | ORAL_CAPSULE | Freq: Every day | ORAL | 11 refills | Status: AC

## 2020-04-16 MED ORDER — BLOOD PRESSURE MONITOR KIT: 1.0000 | PACK | Freq: Once | 0 refills | Status: AC

## 2020-04-16 NOTE — Addendum Note (Signed)
Addended by: Marya Landry D on: 04/16/2020 09:36 AM   Modules accepted: Orders

## 2020-04-16 NOTE — Progress Notes (Addendum)
I connected with  Shannon Rivas on 04/16/20 by a telemedicine application and verified that I am speaking with the correct person using two identifiers.   I discussed the limitations of evaluation and management by telemedicine. The patient expressed understanding and agreed to proceed.   PRENATAL INTAKE SUMMARY  Shannon Rivas presents today New OB Nurse Interview.  OB History    Gravida  1   Para      Term      Preterm      AB      Living        SAB      TAB      Ectopic      Multiple      Live Births            I have reviewed the patient's medical, obstetrical, social, and family histories, medications, and available lab results.  SUBJECTIVE She has no unusual complaints  OBJECTIVE Initial Intake Exam (New OB)  GENERAL APPEARANCE: sounds well via televisit   ASSESSMENT Normal pregnancy  PLAN Prenatal care at [facility] Blood pressure kit ordered PNV ordered Orders Placed This Encounter  Procedures  . [place] MFM OB COMP + 14 WK

## 2020-04-17 NOTE — Progress Notes (Signed)
Patient was assessed and managed by nursing staff during this encounter. I have reviewed the chart and agree with the documentation and plan. I have also made any necessary editorial changes.  Catalina Antigua, MD 04/17/2020 10:57 AM

## 2020-04-23 ENCOUNTER — Other Ambulatory Visit: Payer: Self-pay

## 2020-04-23 ENCOUNTER — Other Ambulatory Visit (HOSPITAL_COMMUNITY)
Admission: RE | Admit: 2020-04-23 | Discharge: 2020-04-23 | Disposition: A | Discharge status: Home / Self Care | Payer: POS | Source: Ambulatory Visit | Attending: Obstetrics | Admitting: Obstetrics

## 2020-04-23 ENCOUNTER — Encounter: Payer: Self-pay | Admitting: Obstetrics

## 2020-04-23 ENCOUNTER — Ambulatory Visit (INDEPENDENT_AMBULATORY_CARE_PROVIDER_SITE_OTHER): Payer: POS | Admitting: Obstetrics

## 2020-04-23 VITALS — BP 112/66 | HR 81 | Wt 234.0 lb

## 2020-04-23 DIAGNOSIS — Z3480 Encounter for supervision of other normal pregnancy, unspecified trimester: Secondary | ICD-10-CM | POA: Insufficient documentation

## 2020-04-23 DIAGNOSIS — Z3A21 21 weeks gestation of pregnancy: Secondary | ICD-10-CM

## 2020-04-23 DIAGNOSIS — Z3687 Encounter for antenatal screening for uncertain dates: Secondary | ICD-10-CM

## 2020-04-23 DIAGNOSIS — O99212 Obesity complicating pregnancy, second trimester: Secondary | ICD-10-CM

## 2020-04-23 DIAGNOSIS — O99210 Obesity complicating pregnancy, unspecified trimester: Secondary | ICD-10-CM

## 2020-04-23 DIAGNOSIS — Z3482 Encounter for supervision of other normal pregnancy, second trimester: Secondary | ICD-10-CM

## 2020-04-23 NOTE — Progress Notes (Addendum)
Subjective:    Shannon Rivas is being seen today for her first obstetrical visit.  This is not a planned pregnancy. She is at [redacted]w[redacted]d gestation. Her obstetrical history is significant for obesity. Relationship with FOB: significant other, living together. Patient does intend to breast feed. Pregnancy history fully reviewed.  The information documented in the HPI was reviewed and verified.  Menstrual History: OB History    Gravida  2   Para  1   Term  1   Preterm      AB      Living  1     SAB      TAB      Ectopic      Multiple      Live Births  1            Patient's last menstrual period was 11/22/2019 (within days).    Past Medical History:  Diagnosis Date  . Obesity     History reviewed. No pertinent surgical history.  (Not in a hospital admission)  No Known Allergies  Social History   Tobacco Use  . Smoking status: Former Smoker    Types: Cigarettes  . Smokeless tobacco: Never Used  . Tobacco comment: no smoking for 4 years+  Substance Use Topics  . Alcohol use: Not Currently    Comment: socially     Family History  Problem Relation Age of Onset  . Diabetes Mother   . Hypertension Mother   . Thyroid disease Mother   . Ulcers Father   . Cancer Maternal Grandmother   . Diabetes Maternal Grandmother   . Hypertension Maternal Grandmother   . COPD Maternal Grandmother   . Cancer Maternal Grandfather      Review of Systems Constitutional: negative for weight loss Gastrointestinal: negative for vomiting Genitourinary:negative for genital lesions and vaginal discharge and dysuria Musculoskeletal:negative for back pain Behavioral/Psych: negative for abusive relationship, depression, illegal drug usage and tobacco use    Objective:    BP 112/66   Pulse 81   Wt 234 lb (106.1 kg)   LMP 11/22/2019 (Within Days)   BMI 41.45 kg/m  General Appearance:    Alert, cooperative, no distress, appears stated age  Head:    Normocephalic, without  obvious abnormality, atraumatic  Eyes:    PERRL, conjunctiva/corneas clear, EOM's intact, fundi    benign, both eyes  Ears:    Normal TM's and external ear canals, both ears  Nose:   Nares normal, septum midline, mucosa normal, no drainage    or sinus tenderness  Throat:   Lips, mucosa, and tongue normal; teeth and gums normal  Neck:   Supple, symmetrical, trachea midline, no adenopathy;    thyroid:  no enlargement/tenderness/nodules; no carotid   bruit or JVD  Back:     Symmetric, no curvature, ROM normal, no CVA tenderness  Lungs:     Clear to auscultation bilaterally, respirations unlabored  Chest Wall:    No tenderness or deformity   Heart:    Regular rate and rhythm, S1 and S2 normal, no murmur, rub   or gallop  Breast Exam:    No tenderness, masses, or nipple abnormality  Abdomen:     Soft, non-tender, bowel sounds active all four quadrants,    no masses, no organomegaly  Genitalia:    Normal female without lesion, discharge or tenderness  Extremities:   Extremities normal, atraumatic, no cyanosis or edema  Pulses:   2+ and symmetric all extremities  Skin:  Skin color, texture, turgor normal, no rashes or lesions  Lymph nodes:   Cervical, supraclavicular, and axillary nodes normal  Neurologic:   CNII-XII intact, normal strength, sensation and reflexes    throughout      Lab Review Urine pregnancy test Labs reviewed yes Radiologic studies reviewed no  Assessment:    Pregnancy at [redacted]w[redacted]d weeks    Plan:     1. Supervision of other normal pregnancy, antepartum Rx: - CBC/D/Plt+RPR+Rh+ABO+Rub Ab... - Cytology - PAP( [facility]) - Cervicovaginal ancillary only( ) - Culture, OB Urine - Enroll Patient in Babyscripts - Babyscripts Schedule Optimization  2. Unsure of LMP (last menstrual period) as reason for ultrasound scan Rx: - [place] MFM OB COMP + 14 WK; Future  3. Obesity affecting pregnancy, antepartum   Prenatal vitamins.  Counseling provided regarding  continued use of seat belts, cessation of alcohol consumption, smoking or use of illicit drugs; infection precautions i.e., influenza/TDAP immunizations, toxoplasmosis,CMV, parvovirus, listeria and varicella; workplace safety, exercise during pregnancy; routine dental care, safe medications, sexual activity, hot tubs, saunas, pools, travel, caffeine use, fish and methlymercury, potential toxins, hair treatments, varicose veins Weight gain recommendations per IOM guidelines reviewed: underweight/BMI< 18.5--> gain 28 - 40 lbs; normal weight/BMI 18.5 - 24.9--> gain 25 - 35 lbs; overweight/BMI 25 - 29.9--> gain 15 - 25 lbs; obese/BMI >30->gain  11 - 20 lbs Problem list reviewed and updated. FIRST/CF mutation testing/NIPT/QUAD SCREEN/fragile X/Ashkenazi Jewish population testing/Spinal muscular atrophy discussed: requested. Role of ultrasound in pregnancy discussed; fetal survey: requested. Amniocentesis discussed: not indicated.   Orders Placed This Encounter  Procedures  . Culture, OB Urine  . [place] MFM OB COMP + 14 WK    Standing Status:   Future    Standing Expiration Date:   04/23/2021    Order Specific Question:   Reason for Exam (SYMPTOM  OR DIAGNOSIS REQUIRED)    Answer:   Unsure LMP    Order Specific Question:   Preferred Location    Answer:   [facility] Ultrasound  . CBC/D/Plt+RPR+Rh+ABO+Rub Ab...    Follow up in 4 weeks. 50% of 20 min visit spent on counseling and coordination of care.    Brock Bad, MD 04/23/2020 11:12 AM

## 2020-04-24 ENCOUNTER — Other Ambulatory Visit: Payer: Self-pay | Admitting: Obstetrics

## 2020-04-24 DIAGNOSIS — N760 Acute vaginitis: Secondary | ICD-10-CM

## 2020-04-24 DIAGNOSIS — O99019 Anemia complicating pregnancy, unspecified trimester: Secondary | ICD-10-CM

## 2020-04-24 LAB — CBC/D/PLT+RPR+RH+ABO+RUB AB...
Antibody Screen: NEGATIVE
Basophils Absolute: 0 10*3/uL (ref 0.0–0.2)
Basos: 0 %
EOS (ABSOLUTE): 0.1 10*3/uL (ref 0.0–0.4)
Eos: 1 %
HCV Ab: <0.1 s/co ratio (ref 0.0–0.9)
HIV Screen 4th Generation wRfx: NONREACTIVE
Hematocrit: 34.4 % (ref 34.0–46.6)
Hemoglobin: 10.9 g/dL — ABNORMAL LOW (ref 11.1–15.9)
Hepatitis B Surface Ag: NEGATIVE
Immature Grans (Abs): 0.1 10*3/uL (ref 0.0–0.1)
Immature Granulocytes: 1 %
Lymphocytes Absolute: 3.4 10*3/uL — ABNORMAL HIGH (ref 0.7–3.1)
Lymphs: 37 %
MCH: 27.2 pg (ref 26.6–33.0)
MCHC: 31.7 g/dL (ref 31.5–35.7)
MCV: 86 fL (ref 79–97)
Monocytes Absolute: 0.9 10*3/uL (ref 0.1–0.9)
Monocytes: 10 %
Neutrophils Absolute: 4.8 10*3/uL (ref 1.4–7.0)
Neutrophils: 51 %
Platelets: 329 10*3/uL (ref 150–450)
RBC: 4.01 x10E6/uL (ref 3.77–5.28)
RDW: 12.9 % (ref 11.7–15.4)
RPR Ser Ql: NONREACTIVE
Rh Factor: POSITIVE
Rubella Antibodies, IGG: 3.94 index (ref >0.99)
WBC: 9.3 10*3/uL (ref 3.4–10.8)

## 2020-04-24 LAB — CERVICOVAGINAL ANCILLARY ONLY
Bacterial Vaginitis (gardnerella): POSITIVE — AB
Candida Glabrata: NEGATIVE
Candida Vaginitis: NEGATIVE
Chlamydia: NEGATIVE
Comment: NEGATIVE
Comment: NEGATIVE
Comment: NEGATIVE
Comment: NEGATIVE
Comment: NEGATIVE
Comment: NORMAL
Neisseria Gonorrhea: NEGATIVE
Trichomonas: NEGATIVE

## 2020-04-24 LAB — HCV INTERPRETATION

## 2020-04-24 MED ORDER — METRONIDAZOLE 500 MG PO TABS: 500.0000 mg | ORAL_TABLET | Freq: Two times a day (BID) | ORAL | 2 refills | Status: DC

## 2020-04-24 MED ORDER — FERROUS SULFATE 325 (65 FE) MG PO TABS: 325.0000 mg | ORAL_TABLET | Freq: Two times a day (BID) | ORAL | 5 refills | Status: DC

## 2020-04-25 LAB — URINE CULTURE, OB REFLEX

## 2020-04-25 LAB — CULTURE, OB URINE

## 2020-04-26 LAB — CYTOLOGY - PAP
Comment: NEGATIVE
Diagnosis: UNDETERMINED — AB
High risk HPV: POSITIVE — AB

## 2020-05-21 ENCOUNTER — Ambulatory Visit: Payer: POS | Attending: Obstetrics

## 2020-05-21 ENCOUNTER — Other Ambulatory Visit: Payer: Self-pay

## 2020-05-21 ENCOUNTER — Other Ambulatory Visit: Payer: Self-pay | Admitting: Obstetrics

## 2020-05-21 DIAGNOSIS — Z3687 Encounter for antenatal screening for uncertain dates: Secondary | ICD-10-CM | POA: Insufficient documentation

## 2020-05-22 ENCOUNTER — Other Ambulatory Visit: Payer: Self-pay | Admitting: *Deleted

## 2020-05-22 DIAGNOSIS — Z362 Encounter for other antenatal screening follow-up: Secondary | ICD-10-CM

## 2020-05-28 ENCOUNTER — Encounter: Payer: POS | Admitting: Obstetrics

## 2020-05-28 DIAGNOSIS — Z3480 Encounter for supervision of other normal pregnancy, unspecified trimester: Secondary | ICD-10-CM

## 2020-06-04 ENCOUNTER — Other Ambulatory Visit: Payer: Self-pay

## 2020-06-04 ENCOUNTER — Encounter: Payer: Self-pay | Admitting: Obstetrics

## 2020-06-04 ENCOUNTER — Telehealth (INDEPENDENT_AMBULATORY_CARE_PROVIDER_SITE_OTHER): Payer: POS | Admitting: Obstetrics

## 2020-06-04 VITALS — BP 99/62 | HR 86

## 2020-06-04 DIAGNOSIS — Z3480 Encounter for supervision of other normal pregnancy, unspecified trimester: Secondary | ICD-10-CM

## 2020-06-04 DIAGNOSIS — O0932 Supervision of pregnancy with insufficient antenatal care, second trimester: Secondary | ICD-10-CM

## 2020-06-04 DIAGNOSIS — O0930 Supervision of pregnancy with insufficient antenatal care, unspecified trimester: Secondary | ICD-10-CM

## 2020-06-04 DIAGNOSIS — E669 Obesity, unspecified: Secondary | ICD-10-CM

## 2020-06-04 DIAGNOSIS — Z3A27 27 weeks gestation of pregnancy: Secondary | ICD-10-CM

## 2020-06-04 DIAGNOSIS — O99212 Obesity complicating pregnancy, second trimester: Secondary | ICD-10-CM

## 2020-06-04 DIAGNOSIS — O99210 Obesity complicating pregnancy, unspecified trimester: Secondary | ICD-10-CM

## 2020-06-04 NOTE — Progress Notes (Signed)
OBSTETRICS PRENATAL VIRTUAL VISIT ENCOUNTER NOTE  Provider location: Center for [facility] [place]   I connected with Shannon Rivas on 06/04/20 at  3:15 PM EST by MyChart Video Encounter at home and verified that I am speaking with the correct person using two identifiers.   I discussed the limitations, risks, security and privacy concerns of performing an evaluation and management service virtually and the availability of in person appointments. I also discussed with the patient that there may be a patient responsible charge related to this service. The patient expressed understanding and agreed to proceed. Subjective:  Shannon Rivas is a 34 y.o. G2P1001 at [redacted]w[redacted]d being seen today for ongoing prenatal care.  She is currently monitored for the following issues for this low-risk pregnancy and has Supervision of other normal pregnancy, antepartum on their problem list.  Patient reports no complaints.  Contractions: Not present. Vag. Bleeding: None.  Movement: Present. Denies any leaking of fluid.   The following portions of the patient's history were reviewed and updated as appropriate: allergies, current medications, past family history, past medical history, past social history, past surgical history and problem list.   Objective:   Vitals:   06/04/20 1604  BP: 99/62  Pulse: 86    Fetal Status:     Movement: Present     General:  Alert, oriented and cooperative. Patient is in no acute distress.  Respiratory: Normal respiratory effort, no problems with respiration noted  Mental Status: Normal mood and affect. Normal behavior. Normal judgment and thought content.  Rest of physical exam deferred due to type of encounter  Imaging: [place] MFM OB DETAIL +14 WK  Result Date: 05/21/2020 ----------------------------------------------------------------------  OBSTETRICS REPORT                       (Signed Final 05/21/2020 04:39 pm)  ---------------------------------------------------------------------- Patient Info  ID #:       [id number]                          D.O.B.:  1985/10/29 (34 yrs)  Name:       Shannon Rivas               Visit Date: 05/21/2020 02:54 pm ---------------------------------------------------------------------- Performed By  Attending:        Ma Rings MD         Ref. Address:     [address]                                                             [phone]  [place]                                                             [address]  Performed By:     Tommie Raymond BS,       [facility], [facility]T                                [facility]  Referred By:      [facility] ---------------------------------------------------------------------- Orders  #  Description                           Code        Ordered By  1  [place] MFM OB DETAIL +14 WK               76811.01    Antwoin Lackey ----------------------------------------------------------------------  #  Order #                     Accession #                Episode #  1  [id number]                   [id number]                 [id number] ---------------------------------------------------------------------- Indications  Obesity complicating pregnancy, second         O99.212  trimester (BMI 40)  [redacted] weeks gestation of pregnancy                Z3A.27  Anemia during pregnancy in second trimester    O99.012  Encounter for uncertain dates                  Z36.87  Late prenatal care, second trimester           O09.32  Encounter for antenatal screening for          Z36.3  malformations ---------------------------------------------------------------------- Fetal  Evaluation  Num Of Fetuses:         1  Fetal Heart Rate(bpm):  133  Cardiac Activity:       Observed  Presentation:           Variable  Placenta:  Posterior Fundal  P. Cord Insertion:      Visualized, central  Amniotic Fluid  AFI FV:      Subjectively upper-normal                              Largest Pocket(cm)                              8.3 ---------------------------------------------------------------------- Biometry  BPD:      67.4  mm     G. Age:  27w 1d         33  %    CI:         73.8   %    70 - 86                                                          FL/HC:      19.9   %    18.6 - 20.4  HC:      249.2  mm     G. Age:  27w 0d         16  %    HC/AC:      1.04        1.05 - 1.21  AC:      238.5  mm     G. Age:  28w 1d         69  %    FL/BPD:     73.4   %    71 - 87  FL:       49.5  mm     G. Age:  26w 5d         19  %    FL/AC:      20.8   %    20 - 24  HUM:      46.2  mm     G. Age:  27w 2d         46  %  CER:        35  mm     G. Age:  29w 3d     > 97.7  %  LV:        5.2  mm  CM:        8.2  mm  Est. FW:    1081  gm      2 lb 6 oz     45  % ---------------------------------------------------------------------- OB History  Blood Type:   O+  Gravidity:    2         Term:   1        Prem:   0        SAB:   0  TOP:          0       Ectopic:  0        Living: 1 ---------------------------------------------------------------------- Gestational Age  LMP:           25w 6d        Date:  11/22/19                 EDD:   08/28/20  U/S Today:  27w 2d                                        EDD:   08/18/20  Best:          27w 2d     Det. By:  U/S (05/21/20)           EDD:   08/18/20 ---------------------------------------------------------------------- Anatomy  Cranium:               Appears normal         Aortic Arch:            Not well visualized  Cavum:                 Appears normal         Ductal Arch:            Not well visualized  Ventricles:            Appears normal         Diaphragm:               Appears normal  Choroid Plexus:        Appears normal         Stomach:                Appears normal, left                                                                        sided  Cerebellum:            Appears normal         Abdomen:                Appears normal  Posterior Fossa:       Appears normal         Abdominal Wall:         Appears nml (cord                                                                        insert, abd wall)  Nuchal Fold:           Not applicable (>20    Cord Vessels:           Appears normal ([redacted]                         wks GA)                                        vessel cord)  Face:                  Appears normal         Kidneys:  Appear normal                         (orbits and profile)  Lips:                  Appears normal         Bladder:                Appears normal  Thoracic:              Appears normal         Spine:                  Appears normal  Heart:                 Appears normal         Upper Extremities:      Appears normal                         (4CH, axis, and                         situs)  RVOT:                  Appears normal         Lower Extremities:      Appears normal  LVOT:                  Appears normal  Other:  Fetus appears to be a female. Hands and feet not well visualized.          Nasal bone visualized. ---------------------------------------------------------------------- Cervix Uterus Adnexa  Cervix  Length:           4.19  cm.  Normal appearance by transabdominal scan.  Uterus  No abnormality visualized.  Right Ovary  Within normal limits. No adnexal mass visualized.  Left Ovary  Within normal limits. No adnexal mass visualized.  Cul De Sac  No free fluid seen.  Adnexa  No abnormality visualized. ---------------------------------------------------------------------- Comments  This patient was seen for a detailed fetal anatomy scan due  to maternal obesity.  The patient only presented for prenatal  care a few weeks  ago.  She denies any significant past medical history and denies  any problems in her current pregnancy.  As she presented late for prenatal care, she did not have any  screening test for fetal aneuploidy drawn in her current  pregnancy.  Based on the fetal biometry measurements obtained today,  her EDC should be August 18, 2020.  Although limited due to her advanced gestational age, there  were no obvious fetal anomalies noted on today's ultrasound  exam.  The patient was informed that anomalies may be missed due  to technical limitations. If the fetus is in a suboptimal position  or maternal habitus is increased, visualization of the fetus in  the maternal uterus may be impaired.  A follow-up exam was scheduled in 4 weeks to confirm her  dates. ----------------------------------------------------------------------                   Ma Rings, MD Electronically Signed Final Report   05/21/2020 04:39 pm ----------------------------------------------------------------------   Assessment and Plan:  Pregnancy: G2P1001 at 100w6d 1. Supervision of other normal pregnancy, antepartum  2. Late prenatal care  3. Obesity affecting pregnancy, antepartum   Preterm labor symptoms and  general obstetric precautions including but not limited to vaginal bleeding, contractions, leaking of fluid and fetal movement were reviewed in detail with the patient. I discussed the assessment and treatment plan with the patient. The patient was provided an opportunity to ask questions and all were answered. The patient agreed with the plan and demonstrated an understanding of the instructions. The patient was advised to call back or seek an in-person office evaluation/go to [facility] at [facility] for any urgent or concerning symptoms. Please refer to After Visit Summary for other counseling recommendations.   I provided 15 minutes of face-to-face time during this encounter.  Return in about 1 week (around  06/11/2020) for ROB with midwife to discuss birthing options, 2 hour OGTT.  Future Appointments  Date Time Provider Department Center  06/18/2020  3:00 PM [facility] NURSE [facility]  06/18/2020  3:15 PM [facility] US3 [facility]    Coral Ceo, MD Center for [facility], [facility] 06/04/20

## 2020-06-11 ENCOUNTER — Ambulatory Visit (INDEPENDENT_AMBULATORY_CARE_PROVIDER_SITE_OTHER): Payer: POS | Admitting: Advanced Practice Midwife

## 2020-06-11 ENCOUNTER — Other Ambulatory Visit: Payer: Self-pay

## 2020-06-11 ENCOUNTER — Other Ambulatory Visit: Payer: POS

## 2020-06-11 VITALS — BP 118/62 | HR 76 | Wt 238.0 lb

## 2020-06-11 DIAGNOSIS — R102 Pelvic and perineal pain: Secondary | ICD-10-CM

## 2020-06-11 DIAGNOSIS — Z3480 Encounter for supervision of other normal pregnancy, unspecified trimester: Secondary | ICD-10-CM

## 2020-06-11 DIAGNOSIS — O26893 Other specified pregnancy related conditions, third trimester: Secondary | ICD-10-CM

## 2020-06-11 DIAGNOSIS — Z3A28 28 weeks gestation of pregnancy: Secondary | ICD-10-CM

## 2020-06-11 LAB — POCT URINALYSIS DIPSTICK
Bilirubin, UA: NEGATIVE
Blood, UA: NEGATIVE
Glucose, UA: NEGATIVE
Ketones, UA: NEGATIVE
Nitrite, UA: NEGATIVE
Protein, UA: NEGATIVE
Spec Grav, UA: 1.02 (ref 1.010–1.025)
Urobilinogen, UA: 0.2 E.U./dL
pH, UA: 6.5 (ref 5.0–8.0)

## 2020-06-11 MED ORDER — BREAST PUMP MISC: 1.0000 | Freq: Every day | 0 refills | Status: AC

## 2020-06-11 MED ORDER — COMFORT FIT MATERNITY SUPP MED MISC: 1.0000 | Freq: Every day | 0 refills | Status: DC

## 2020-06-11 NOTE — Patient Instructions (Addendum)
Meet the Provider Zoom Sessions      [facility] for [facility] is now offering FREE monthly 1-hour virtual Zoom sessions for new, current, and prospective patients.        During these sessions, you can:   Learn about our practice, model of care, services   Get answers to questions about pregnancy and birth during COVID   Pick your providers brain about anything else!    Sessions will be hosted by [employer] for [employer], Producer, television/film/video, Physicians and Midwives          No registration required      2021 Dates:      All at 6pm     October 21st     November 18th   December 16th     January 20th  February 17th    To join one of these meetings, a few minutes before it is set to start:     Copy/paste the link into your web browser:  [email or web address]    OR  Scan the QR code below (open up your camera and point towards QR code; click on tab that pops up on your phone ("zoom")   I nformation about Lotus Birth: Lotus birth, otherwise known as Umbilical Cord Nonseverence Building control surveyor ) is the practice of allowing the umbilical cord to naturally separate from the placenta,  rather than having your birth attendant or support person cut the cord shortly after birth. [facility] [employer] [facility] have reviewed the available data on lotus birth and determined that the infection risks posed to the newborn are significant, and therefore we do not endorse lotus birth. In the event of a patient request for lotus birth, the patient's right to make this decision for themselves will be respected. The birth team will discuss infection risk and document patient's decision to leave the cord intact, notifying the Pediatrics team of patient's stated preference. Please understand that lotus birth may impact your postpartum care and the  length of time you are advised to remain in the hospital after your birth. We encourage you to have ongoing discussions during your prenatal visits and Pediatrician interviews, as appropriate. Thank you for sharing your journey with the [facility] and we hope to be a trusted part of your health for many years to come.   Considering Waterbirth? Guide for patients at Center for [employer] Why consider waterbirth?  Gentle birth for babies   Less pain medicine used in labor   May allow for passive descent/less pushing   May reduce perineal tears   More mobility and instinctive maternal position changes   Increased maternal relaxation   Reduced blood pressure in labor   Is waterbirth safe? What are the risks of infection, drowning or other complications?  Infection:   Very low risk (3.7 % for tub vs 4.8% for bed)   7 in 8000 waterbirths with documented infection   Poorly cleaned equipment most common cause   Slightly lower group B strep transmission rate   Drowning   Maternal:   Very low risk   Related to seizures or fainting   Newborn:   Very low risk. No evidence of increased risk of respiratory problems in multiple large studies   Physiological protection from breathing under water   Avoid underwater birth if there are any fetal complications   Once baby's head is out of the water, keep it out.  Birth complication   Some reports of cord trauma, but risk decreased by bringing baby to surface gradually   No evidence of increased risk of shoulder dystocia. Mothers can usually change positions faster in water than in a bed, possibly aiding the maneuvers to free the shoulder.  ? You must attend a Waterbirth class at [employer] at [facility]  3rd Wednesday of every month from 7-9pm   [facility] by calling [phone] or online at [email or web address]   Bring [place] the certificate from the class to your prenatal  appointment  Meet with a midwife at 36 weeks to see if you can still plan a waterbirth and to sign the consent.   If you plan a waterbirth at [employer] and [facility] at [facility], the following purchases are optional:  Clinical research associate top   [employer]    Things that would prevent you from having a waterbirth:  Unknown or Positive COVID-19 diagnosis upon admission to hospital   Premature, <37wks   Previous cesarean birth   Presence of thick meconium-stained fluid   Multiple gestation (Twins, triplets, etc.)   Uncontrolled diabetes or gestational diabetes requiring medication   Hypertension diagnosed in pregnancy or preexisting hypertension (gestational hypertension, preeclampsia, or chronic hypertension)  Heavy vaginal bleeding   Non-reassuring fetal heart rate   Active infection (MRSA, etc.). Group B Strep is NOT a contraindication for waterbirth.   If your labor has to be induced and induction method requires continuous monitoring of the baby's heart rate   Other risks/issues identified by your obstetrical provider    Please remember that birth is unpredictable. Under certain unforeseeable circumstances your provider may advise against giving birth in the tub. These decisions will be made on a case-by-case basis and with the safety of you and your baby as our highest priority.  **Please remember that in order to have a waterbirth, you must test Negative to COVID-19 upon admission to the hospital.**

## 2020-06-11 NOTE — Progress Notes (Signed)
Pt has increase in pelvic pressure, would like urine check today as well.

## 2020-06-11 NOTE — Progress Notes (Signed)
   PRENATAL VISIT NOTE  Subjective:  Shannon Rivas is a 34 y.o. G2P1001 at [redacted]w[redacted]d being seen today for ongoing prenatal care.  She is currently monitored for the following issues for this low-risk pregnancy and has Supervision of other normal pregnancy, antepartum on their problem list.  Patient reports increased pelvic pressure, no cramping/contractions.  Contractions: Not present. Vag. Bleeding: None.  Movement: Present. Denies leaking of fluid.   The following portions of the patient's history were reviewed and updated as appropriate: allergies, current medications, past family history, past medical history, past social history, past surgical history and problem list.   Objective:   Vitals:   06/11/20 0828  BP: 118/62  Pulse: 76  Weight: 238 lb (108 kg)    Fetal Status: Fetal Heart Rate (bpm): 135   Movement: Present     General:  Alert, oriented and cooperative. Patient is in no acute distress.  Skin: Skin is warm and dry. No rash noted.   Cardiovascular: Normal heart rate noted  Respiratory: Normal respiratory effort, no problems with respiration noted  Abdomen: Soft, gravid, appropriate for gestational age.  Pain/Pressure: Present     Pelvic: Cervical exam deferred        Extremities: Normal range of motion.     Mental Status: Normal mood and affect. Normal behavior. Normal judgment and thought content.   Assessment and Plan:  Pregnancy: G2P1001 at [redacted]w[redacted]d 1. Supervision of other normal pregnancy, antepartum --Anticipatory guidance about next visits/weeks of pregnancy given. --Next visit in 2 weeks, in office with midwife  --Patient verbalized preference for lotus birth. Risks of lutus birth were discussed including newborn infection and possibility of longer hospital stay to allow for assessment of newborn sepsis by Pediatrics team. Patient verbalized understanding of possible impact to newborn health and assumption of risks. Pt to decide. --Discussed routine  medications/labs/vaccines with pt. Pt wants to decline eye ointment, is considering Vitamin K, and does not want any vaccines for the baby.  Discussed risks/benefits of each, reasons they are routine, and validated pt autonomy regarding decisions for her baby. --Pt desires waterbirth.  She had unmedicated birth in [facility] with last baby 14 years ago.  Low risk at this time so no barriers. --Printed and verbal education given, pt to enroll in class --Pt also interested in homebirth, discussed that our practice/midwives do not provide  homebirth services so pt given contact information for CNMs in [place] area so she can research. --List of doulas given for labor support options   2. [redacted] weeks gestation of pregnancy  - Glucose Tolerance, 2 Hours w/1 Hour - CBC - HIV Antibody (routine testing w rflx) - RPR  3. Pelvic pressure in pregnancy, third trimester  --urine culture today - Elastic Bandages & Supports (COMFORT FIT MATERNITY SUPP MED) MISC; 1 Device by Does not apply route daily.  Dispense: 1 each; Refill: 0  Preterm labor symptoms and general obstetric precautions including but not limited to vaginal bleeding, contractions, leaking of fluid and fetal movement were reviewed in detail with the patient. Please refer to After Visit Summary for other counseling recommendations.   Return in about 2 weeks (around 06/25/2020).  Future Appointments  Date Time Provider Department Center  06/18/2020  3:00 PM [facility]  06/18/2020  3:15 PM [facility] US3 [facility]  06/28/2020  8:35 AM Sharyon Cable, CNM [facility] None    [name], CNM

## 2020-06-12 LAB — CBC
Hematocrit: 32.9 % — ABNORMAL LOW (ref 34.0–46.6)
Hemoglobin: 10.7 g/dL — ABNORMAL LOW (ref 11.1–15.9)
MCH: 27.8 pg (ref 26.6–33.0)
MCHC: 32.5 g/dL (ref 31.5–35.7)
MCV: 86 fL (ref 79–97)
Platelets: 230 10*3/uL (ref 150–450)
RBC: 3.85 x10E6/uL (ref 3.77–5.28)
RDW: 13.3 % (ref 11.7–15.4)
WBC: 9.4 10*3/uL (ref 3.4–10.8)

## 2020-06-12 LAB — GLUCOSE TOLERANCE, 2 HOURS W/ 1HR
Glucose, 1 hour: 123 mg/dL (ref 65–179)
Glucose, 2 hour: 101 mg/dL (ref 65–152)
Glucose, Fasting: 74 mg/dL (ref 65–91)

## 2020-06-12 LAB — HIV ANTIBODY (ROUTINE TESTING W REFLEX): HIV Screen 4th Generation wRfx: NONREACTIVE

## 2020-06-12 LAB — RPR: RPR Ser Ql: NONREACTIVE

## 2020-06-13 LAB — URINE CULTURE, OB REFLEX

## 2020-06-13 LAB — CULTURE, OB URINE

## 2020-06-17 ENCOUNTER — Encounter: Payer: POS | Admitting: Advanced Practice Midwife

## 2020-06-18 ENCOUNTER — Ambulatory Visit: Payer: POS | Attending: Obstetrics

## 2020-06-18 ENCOUNTER — Ambulatory Visit: Payer: POS | Admitting: *Deleted

## 2020-06-18 ENCOUNTER — Encounter: Payer: Self-pay | Admitting: *Deleted

## 2020-06-18 ENCOUNTER — Other Ambulatory Visit: Payer: Self-pay

## 2020-06-18 DIAGNOSIS — Z3480 Encounter for supervision of other normal pregnancy, unspecified trimester: Secondary | ICD-10-CM | POA: Diagnosis present

## 2020-06-18 DIAGNOSIS — Z3687 Encounter for antenatal screening for uncertain dates: Secondary | ICD-10-CM

## 2020-06-18 DIAGNOSIS — D649 Anemia, unspecified: Secondary | ICD-10-CM | POA: Diagnosis not present

## 2020-06-18 DIAGNOSIS — O99213 Obesity complicating pregnancy, third trimester: Secondary | ICD-10-CM | POA: Diagnosis not present

## 2020-06-18 DIAGNOSIS — Z362 Encounter for other antenatal screening follow-up: Secondary | ICD-10-CM | POA: Insufficient documentation

## 2020-06-18 DIAGNOSIS — O0933 Supervision of pregnancy with insufficient antenatal care, third trimester: Secondary | ICD-10-CM

## 2020-06-18 DIAGNOSIS — O99013 Anemia complicating pregnancy, third trimester: Secondary | ICD-10-CM | POA: Diagnosis not present

## 2020-06-18 DIAGNOSIS — Z3A31 31 weeks gestation of pregnancy: Secondary | ICD-10-CM

## 2020-06-19 ENCOUNTER — Other Ambulatory Visit: Payer: Self-pay | Admitting: *Deleted

## 2020-06-19 DIAGNOSIS — Z6841 Body Mass Index (BMI) 40.0 and over, adult: Secondary | ICD-10-CM

## 2020-06-28 ENCOUNTER — Ambulatory Visit (INDEPENDENT_AMBULATORY_CARE_PROVIDER_SITE_OTHER): Payer: POS | Admitting: Certified Nurse Midwife

## 2020-06-28 ENCOUNTER — Encounter: Payer: Self-pay | Admitting: Certified Nurse Midwife

## 2020-06-28 ENCOUNTER — Other Ambulatory Visit: Payer: Self-pay

## 2020-06-28 VITALS — BP 120/75 | HR 87 | Wt 239.0 lb

## 2020-06-28 DIAGNOSIS — Z3480 Encounter for supervision of other normal pregnancy, unspecified trimester: Secondary | ICD-10-CM

## 2020-06-28 DIAGNOSIS — Z3A32 32 weeks gestation of pregnancy: Secondary | ICD-10-CM

## 2020-06-28 NOTE — Patient Instructions (Addendum)
Register by calling [phone] or register online at [email or web address]   Considering Linden Dolin? Guide for patients at Center for [facility]) Why consider waterbirth? . Gentle birth for babies  . Less pain medicine used in labor  . May allow for passive descent/less pushing  . May reduce perineal tears  . More mobility and instinctive maternal position changes  . Increased maternal relaxation   Is waterbirth safe? What are the risks of infection, drowning or other complications? . Infection:  Marland Kitchen Very low risk (3.7 % for tub vs 4.8% for bed)  . 7 in 8000 waterbirths with documented infection  . Poorly cleaned equipment most common cause  . Slightly lower group B strep transmission rate  . Drowning  . Maternal:  . Very low risk  . Related to seizures or fainting  . Newborn:  Marland Kitchen Very low risk. No evidence of increased risk of respiratory problems in multiple large studies  . Physiological protection from breathing under water  . Avoid underwater birth if there are any fetal complications  . Once baby's head is out of the water, keep it out.  . Birth complication  . Some reports of cord trauma, but risk decreased by bringing baby to surface gradually  . No evidence of increased risk of shoulder dystocia. Mothers can usually change positions faster in water than in a bed, possibly aiding the maneuvers to free the shoulder.   There are 2 things you MUST do to have a waterbirth with [facility]: 1. Attend a waterbirth class at [facility] at [facility]   a. 3rd Wednesday of every month from 7-9 pm (virtual during COVID) b. Free c. Register by calling [phone] or register online at [email or web address] d. Bring [place] the certificate from the class to your prenatal appointment or send via MyChart 2. Meet with a midwife at 36 weeks* to see if you can still plan a waterbirth and to sign the consent.   *We also recommend that you schedule as many of  your prenatal visits with a midwife as possible.    Helpful information: . You may want to bring a bathing suit top to the hospital to wear during labor but this is optional.  All other supplies are provided by the hospital. . Please arrive at the hospital with signs of active labor, and do not wait at home until late in labor. It takes 45 min- 2 hours for COVID testing, fetal monitoring, and check in to your room to take place, plus transport and filling of the waterbirth tub.    Things that would prevent you from having a waterbirth: . Unknown or Positive COVID-19 diagnosis upon admission to hospital* . Premature, <37wks  . Previous cesarean birth  . Presence of thick meconium-stained fluid  . Multiple gestation (Twins, triplets, etc.)  . Uncontrolled diabetes or gestational diabetes requiring medication  . Hypertension diagnosed in pregnancy or preexisting hypertension (gestational hypertension, preeclampsia, or chronic hypertension) . Heavy vaginal bleeding  . Non-reassuring fetal heart rate  . Active infection (MRSA, etc.). Group B Strep is NOT a contraindication for waterbirth.  . If your labor has to be induced and induction method requires continuous monitoring of the baby's heart rate  . Other risks/issues identified by your obstetrical provider   Please remember that birth is unpredictable. Under certain unforeseeable circumstances your provider may advise against giving birth in the tub. These decisions will be made on a case-by-case basis and with the safety of you and your baby  as our highest priority.   *Please remember that in order to have a waterbirth, you must test Negative to COVID-19 upon admission to the hospital.  Updated 06/11/2020   Group B Streptococcus Test During Pregnancy Why am I having this test? Routine testing, also called screening, for group B streptococcus (GBS) is recommended for all pregnant women between the 36th and 37th week of pregnancy. GBS is a  type of bacteria that can be passed from mother to baby during childbirth. Screening will help guide whether or not you will need treatment during labor and delivery to prevent complications such as:  An infection in your uterus during labor.  An infection in your uterus after delivery.  A serious infection in your baby after delivery, such as pneumonia, meningitis, or sepsis. GBS screening is not often done before 36 weeks of pregnancy unless you go into labor prematurely. What happens if I have group B streptococcus? If testing shows that you have GBS, your health care provider will recommend treatment with IV antibiotics during labor and delivery. This treatment significantly decreases the risk of complications for you and your baby. If you have a planned C-section and you have GBS, you may not need to be treated with antibiotics because GBS is usually passed to babies after labor starts and your water breaks. If you are in labor or your water breaks before your C-section, it is possible for GBS to get into your uterus and be passed to your baby, so you might need treatment. Is there a chance I may not need to be tested? You may not need to be tested for GBS if:  You have a urine test that shows GBS before 36 to 37 weeks.  You had a baby with GBS infection after a previous delivery. In these cases, you will automatically be treated for GBS during labor and delivery. What is being tested? This test is done to check if you have group B streptococcus in your vagina or rectum. What kind of sample is taken? To collect samples for this test, your health care provider will swab your vagina and rectum with a cotton swab. The sample is then sent to the lab to see if GBS is present. What happens during the test?   You will remove your clothing from the waist down.  You will lie down on an exam table in the same position as you would for a pelvic exam.  Your health care provider will swab your  vagina and rectum to collect samples for a culture test.  You will be able to go home after the test and do all your usual activities. How are the results reported? The test results are reported as positive or negative. What do the results mean?  A positive test means you are at risk for passing GBS to your baby during labor and delivery. Your health care provider will recommend that you are treated with an IV antibiotic during labor and delivery.  A negative test means you are at very low risk of passing GBS to your baby. There is still a low risk of passing GBS to your baby because sometimes test results may report that you do not have a condition when you do (false-negative result) or there is a chance that you may become infected with GBS after the test is done. You most likely will not need to be treated with an antibiotic during labor and delivery. Talk with your health care provider about what your results  mean. Questions to ask your health care provider Ask your health care provider, or the department that is doing the test:  When will my results be ready?  How will I get my results?  What are my treatment options? Summary  Routine testing (screening) for group B streptococcus (GBS) is recommended for all pregnant women between the 36th and 37th week of pregnancy.  GBS is a type of bacteria that can be passed from mother to baby during childbirth.  If testing shows that you have GBS, your health care provider will recommend that you are treated with IV antibiotics during labor and delivery. This treatment almost always prevents infection in newborns. This information is not intended to replace advice given to you by your health care provider. Make sure you discuss any questions you have with your health care provider. Document Revised: 11/03/2018 Document Reviewed: 08/10/2018 Elsevier Patient Education  2020 [employer].

## 2020-06-28 NOTE — Progress Notes (Signed)
   PRENATAL VISIT NOTE  Subjective:  Shannon Rivas is a 34 y.o. G2P1001 at [redacted]w[redacted]d being seen today for ongoing prenatal care.  She is currently monitored for the following issues for this low-risk pregnancy and has Supervision of other normal pregnancy, antepartum on their problem list.  Patient reports no complaints.  Contractions: Not present. Vag. Bleeding: None.  Movement: Present. Denies leaking of fluid.   The following portions of the patient's history were reviewed and updated as appropriate: allergies, current medications, past family history, past medical history, past social history, past surgical history and problem list.   Objective:   Vitals:   06/28/20 0849  BP: 120/75  Pulse: 87  Weight: 239 lb (108.4 kg)    Fetal Status: Fetal Heart Rate (bpm): 140 Fundal Height: 31 cm Movement: Present     General:  Alert, oriented and cooperative. Patient is in no acute distress.  Skin: Skin is warm and dry. No rash noted.   Cardiovascular: Normal heart rate noted  Respiratory: Normal respiratory effort, no problems with respiration noted  Abdomen: Soft, gravid, appropriate for gestational age.  Pain/Pressure: Absent     Pelvic: Cervical exam deferred        Extremities: Normal range of motion.     Mental Status: Normal mood and affect. Normal behavior. Normal judgment and thought content.   Assessment and Plan:  Pregnancy: G2P1001 at [redacted]w[redacted]d 1. Supervision of other normal pregnancy, antepartum - Patient doing well, no complaints - Routine prenatal care - Anticipatory guidance on upcoming appointments with next being virtual  - Educated and discussed waterbirth - encouraged patient to signs up for waterbirth class as soon as possible. Discussed how to sign up for class with patient   2. [redacted] weeks gestation of pregnancy - Discussed with patient plan for delivery, discussed intermittent monitoring, ability to change positions and move freely around room prior to getting in tub     Preterm labor symptoms and general obstetric precautions including but not limited to vaginal bleeding, contractions, leaking of fluid and fetal movement were reviewed in detail with the patient. Please refer to After Visit Summary for other counseling recommendations.   Return in about 2 weeks (around 07/12/2020) for LROB, virtual with midwife or APP.  Future Appointments  Date Time Provider Department Center  07/11/2020  9:35 AM Gerrit Heck, CNM [facility] None  07/17/2020  2:45 PM [facility] NURSE [facility]  07/17/2020  3:00 PM [facility] US1 [facility]    Sharyon Cable, CNM

## 2020-07-11 ENCOUNTER — Telehealth (INDEPENDENT_AMBULATORY_CARE_PROVIDER_SITE_OTHER): Payer: POS

## 2020-07-11 VITALS — BP 113/77 | HR 73

## 2020-07-11 DIAGNOSIS — Z3480 Encounter for supervision of other normal pregnancy, unspecified trimester: Secondary | ICD-10-CM

## 2020-07-11 DIAGNOSIS — Z3483 Encounter for supervision of other normal pregnancy, third trimester: Secondary | ICD-10-CM

## 2020-07-11 DIAGNOSIS — Z3A34 34 weeks gestation of pregnancy: Secondary | ICD-10-CM

## 2020-07-11 NOTE — Progress Notes (Signed)
OBSTETRICS PRENATAL VIRTUAL VISIT ENCOUNTER NOTE  Provider location: [facility]   I connected with Shannon Rivas on 07/11/20 at  9:35 AM EST by MyChart Video Encounter at home and verified that I am speaking with the correct person using two identifiers.   I discussed the limitations, risks, security and privacy concerns of performing an evaluation and management service virtually and the availability of in person appointments. I also discussed with the patient that there may be a patient responsible charge related to this service. The patient expressed understanding and agreed to proceed. Subjective:  Shannon Rivas is a 34 y.o. G2P1001 at 65w4dbeing seen today for ongoing prenatal care.  She is currently monitored for the following issues for this low-risk pregnancy and has Supervision of other normal pregnancy, antepartum on their problem list.  Patient reports pelvic pressure. Patient reports pressure is improved with belly support band.  She reports numbness in her hands that is improved with "shaking it off."  Contractions: Irritability. Vag. Bleeding: None.  Movement: Present. Denies any leaking of fluid.   The following portions of the patient's history were reviewed and updated as appropriate: allergies, current medications, past family history, past medical history, past social history, past surgical history and problem list.   Objective:   Vitals:   07/11/20 0943  BP: 113/77  Pulse: 73    Fetal Status:     Movement: Present     General:  Alert, oriented and cooperative. Patient is in no acute distress.  Respiratory: Normal respiratory effort, no problems with respiration noted  Mental Status: Normal mood and affect. Normal behavior. Normal judgment and thought content.  Rest of physical exam deferred due to type of encounter  Imaging: [place]FM OB FOLLOW UP  Result Date:  06/18/2020 ----------------------------------------------------------------------  OBSTETRICS REPORT                       (Signed Final 06/18/2020 04:54 pm) ---------------------------------------------------------------------- Patient Info  ID #:       [id number]                         D.O.B.:  [birth year](33 yrs)  Name:       Shannon Rivas              Visit Date: 06/18/2020 03:45 pm ---------------------------------------------------------------------- Performed By  Attending:        RTama HighMD        Ref. Address:     [address]  [place]                                                             [place]  Performed By:     Georgie Chard,       [facility]                                                             [place] [facility]                                                             [facility]  Referred By:      [facility] ---------------------------------------------------------------------- Orders  #  Description                           Code        Ordered By  1  [place] MFM OB FOLLOW UP                   [phone]    Peterson Ao ----------------------------------------------------------------------  #  Order #                     Accession #                Episode #  1  [id number]                   [id number]                 [id number] ---------------------------------------------------------------------- Indications  [redacted] weeks gestation of pregnancy                N4M.76  Obesity complicating pregnancy, third          O99.213  trimester (BMI 40)  Anemia during pregnancy in third trimester     O99.013  Encounter for uncertain dates                  Z36.87  Late to prenatal care, third trimester         O09.33   Encounter for other antenatal screening        Z36.2  follow-up ---------------------------------------------------------------------- Fetal Evaluation  Num Of Fetuses:         1  Fetal Heart Rate(bpm):  147  Cardiac Activity:       Observed  Presentation:           Cephalic  Placenta:  Posterior Fundal  P. Cord Insertion:      Previously Visualized  Amniotic Fluid  AFI FV:      Within normal limits  AFI Sum(cm)     %Tile       Largest Pocket(cm)  14.37           50          5.49  RUQ(cm)       RLQ(cm)       LUQ(cm)        LLQ(cm)  5.49          1.72          5.39           1.77 ---------------------------------------------------------------------- Biometry  BPD:      77.9  mm     G. Age:  31w 2d         38  %    CI:        72.67   %    70 - 86                                                          FL/HC:      20.4   %    19.3 - 21.3  HC:      290.6  mm     G. Age:  32w 0d         32  %    HC/AC:      1.03        0.96 - 1.17  AC:      281.4  mm     G. Age:  32w 1d         73  %    FL/BPD:     76.0   %    71 - 87  FL:       59.2  mm     G. Age:  30w 6d         25  %    FL/AC:      21.0   %    20 - 24  HUM:      51.5  mm     G. Age:  30w 1d         27  %  LV:          3  mm  Est. FW:    1818  gm           4 lb     52  % ---------------------------------------------------------------------- OB History  Blood Type:   O+  Gravidity:    2         Term:   1        Prem:   0        SAB:   0  TOP:          0       Ectopic:  0        Living: 1 ---------------------------------------------------------------------- Gestational Age  LMP:           29w 6d        Date:  11/22/19                 EDD:   08/28/20  U/S Today:  31w 4d                                        EDD:   08/16/20  Best:          31w 2d     Det. By:  U/S  (05/21/20)          EDD:   08/18/20 ---------------------------------------------------------------------- Anatomy  Cranium:               Previously seen        Aortic Arch:            Not  well visualized  Cavum:                 Previously seen        Ductal Arch:            Not well visualized  Ventricles:            Previously seen        Diaphragm:              Previously seen  Choroid Plexus:        Previously seen        Stomach:                Previously Seen  Cerebellum:            Previously seen        Abdomen:                Previously seen  Posterior Fossa:       Previously seen        Abdominal Wall:         Previously seen  Nuchal Fold:           Not applicable (>83    Cord Vessels:           Previously seen                         wks GA)  Face:                  Orbits and profile     Kidneys:                Previously seen                         previously seen  Lips:                  Previously seen        Bladder:                Previously seen  Thoracic:              Previously seen        Spine:                  Previously seen  Heart:                 Previously seen        Upper Extremities:      Previously seen  RVOT:                  Previously seen        Lower Extremities:  Previously seen  LVOT:                  Previously seen  Other:  Female gender previously seen. Nasal Nasal bone previously seen. ---------------------------------------------------------------------- Cervix Uterus Adnexa  Cervix  Normal appearance by transabdominal scan.  Uterus  No abnormality visualized.  Right Ovary  Within normal limits. No adnexal mass visualized.  Left Ovary  Within normal limits. No adnexal mass visualized.  Cul De Sac  No free fluid seen.  Adnexa  No abnormality visualized. ---------------------------------------------------------------------- Impression  Patient returned for completion of fetal anatomy (aortic and  ductal arches). She does not have gestational diabetes.  Fetal biometry is consistent with her previously-established  dates (ultrasound performed at her last visit with [place])  .Amniotic fluid is normal and good fetal activity is seen .Aortic  and ductal arches could  not be assessed because of fetal  position.  Maternal obesity imposes limitations on the resolution of  images, and failure to detect fetal anomalies is more common  in obese pregnant women. As maternal obesity makes  clinical assessment of fetal growth difficult, we recommend  serial growth scans until delivery. ---------------------------------------------------------------------- Recommendations  -An appointment was made for her to return in 4 weeks for  fetal growth assessment. ----------------------------------------------------------------------                  Tama High, MD Electronically Signed Final Report   06/18/2020 04:54 pm ----------------------------------------------------------------------   Assessment and Plan:  Pregnancy: G2P1001 at 12w4d1. Supervision of other normal pregnancy, antepartum -Anticipatory guidance for upcoming appts. -Educated on GBS bacteria including what it is, why we test, and how and when we treat if needed. -Discussed patient birth desires and patient reports that she is considering unassisted home birth. -Provider expresses disapproval for this choice, but support for patient to make her own choices as she deems appropriate for her life and beliefs. -Informed patient that she should get a delivery kit and take an infant CPR class to prepare. -Also encouraged patient to have whomever will be present at delivery to take CPR and BLS classes should any negative outcomes should occur. -Patient verbalizes understanding and has no further questions or concerns.    2. [redacted] weeks gestation of pregnancy -Plans to take WB class on Jan 5th. -Does not desire eye ointment or Hep B vaccine, but is okay with Vit K as patient desires infant circ.  Preterm labor symptoms and general obstetric precautions including but not limited to vaginal bleeding, contractions, leaking of fluid and fetal movement were reviewed in detail with the patient. I discussed the assessment and  treatment plan with the patient. The patient was provided an opportunity to ask questions and all were answered. The patient agreed with the plan and demonstrated an understanding of the instructions. The patient was advised to call back or seek an in-person office evaluation/[facility]. Please refer to After Visit Summary for other counseling recommendations.   I provided 10 minutes of face-to-face time during this encounter.  Return in about 2 weeks (around 07/25/2020) for LROB with GBS.  Future Appointments  Date Time Provider DL[place] 07/17/2020  2:45 PM [facility] 07/17/2020  3:00 PM [facility] US1 [facility] [facility]

## 2020-07-11 NOTE — Progress Notes (Signed)
Virtual ROB   CC: Numbness in hands onset last week.  Pt able to check B/P wile on the phone  B/P 113/77  P: 73

## 2020-07-17 ENCOUNTER — Ambulatory Visit: Payer: POS | Admitting: *Deleted

## 2020-07-17 ENCOUNTER — Encounter: Payer: Self-pay | Admitting: *Deleted

## 2020-07-17 ENCOUNTER — Other Ambulatory Visit: Payer: Self-pay

## 2020-07-17 ENCOUNTER — Ambulatory Visit: Payer: POS | Attending: Obstetrics and Gynecology

## 2020-07-17 DIAGNOSIS — D649 Anemia, unspecified: Secondary | ICD-10-CM

## 2020-07-17 DIAGNOSIS — Z3687 Encounter for antenatal screening for uncertain dates: Secondary | ICD-10-CM

## 2020-07-17 DIAGNOSIS — O99213 Obesity complicating pregnancy, third trimester: Secondary | ICD-10-CM | POA: Insufficient documentation

## 2020-07-17 DIAGNOSIS — Z3A35 35 weeks gestation of pregnancy: Secondary | ICD-10-CM | POA: Diagnosis not present

## 2020-07-17 DIAGNOSIS — O0933 Supervision of pregnancy with insufficient antenatal care, third trimester: Secondary | ICD-10-CM

## 2020-07-17 DIAGNOSIS — O99013 Anemia complicating pregnancy, third trimester: Secondary | ICD-10-CM | POA: Diagnosis not present

## 2020-07-17 DIAGNOSIS — Z6841 Body Mass Index (BMI) 40.0 and over, adult: Secondary | ICD-10-CM

## 2020-07-17 DIAGNOSIS — Z362 Encounter for other antenatal screening follow-up: Secondary | ICD-10-CM

## 2020-07-17 DIAGNOSIS — E669 Obesity, unspecified: Secondary | ICD-10-CM

## 2020-07-17 DIAGNOSIS — Z3480 Encounter for supervision of other normal pregnancy, unspecified trimester: Secondary | ICD-10-CM

## 2020-07-22 NOTE — Progress Notes (Signed)
Pt wants to start maternity leave 07/27/20 discussed with office manager ok for pt to start. Pt requested letter to confirm maternity leave as well.

## 2020-07-29 ENCOUNTER — Other Ambulatory Visit: Payer: Self-pay

## 2020-07-29 ENCOUNTER — Other Ambulatory Visit (HOSPITAL_COMMUNITY)
Admission: RE | Admit: 2020-07-29 | Discharge: 2020-07-29 | Disposition: A | Discharge status: Home / Self Care | Payer: POS | Source: Ambulatory Visit | Attending: Advanced Practice Midwife | Admitting: Advanced Practice Midwife

## 2020-07-29 ENCOUNTER — Ambulatory Visit (INDEPENDENT_AMBULATORY_CARE_PROVIDER_SITE_OTHER): Payer: POS | Admitting: Advanced Practice Midwife

## 2020-07-29 VITALS — Wt 239.0 lb

## 2020-07-29 DIAGNOSIS — Z3480 Encounter for supervision of other normal pregnancy, unspecified trimester: Secondary | ICD-10-CM | POA: Diagnosis present

## 2020-07-29 DIAGNOSIS — Z3A37 37 weeks gestation of pregnancy: Secondary | ICD-10-CM

## 2020-07-29 NOTE — Progress Notes (Signed)
   PRENATAL VISIT NOTE  Subjective:  Shannon Rivas is a 35 y.o. G2P1001 at [redacted]w[redacted]d being seen today for ongoing prenatal care.  She is currently monitored for the following issues for this low-risk pregnancy and has Supervision of other normal pregnancy, antepartum on their problem list.  Patient reports no complaints.  Contractions: Irritability. Vag. Bleeding: None.  Movement: Present. Denies leaking of fluid.   The following portions of the patient's history were reviewed and updated as appropriate: allergies, current medications, past family history, past medical history, past social history, past surgical history and problem list.   Objective:   Vitals:   07/29/20 1414  Weight: 239 lb (108.4 kg)    Fetal Status: Fetal Heart Rate (bpm): 150 Fundal Height: 36 cm Movement: Present  Presentation: Vertex  General:  Alert, oriented and cooperative. Patient is in no acute distress.  Skin: Skin is warm and dry. No rash noted.   Cardiovascular: Normal heart rate noted  Respiratory: Normal respiratory effort, no problems with respiration noted  Abdomen: Soft, gravid, appropriate for gestational age.  Pain/Pressure: Absent     Pelvic: Cervical exam performed in the presence of a chaperone Dilation: 1 Effacement (%): 50 Station: -3  Extremities: Normal range of motion.  Edema: None  Mental Status: Normal mood and affect. Normal behavior. Normal judgment and thought content.   Assessment and Plan:  Pregnancy: G2P1001 at [redacted]w[redacted]d 1. Supervision of other normal pregnancy, antepartum --Anticipatory guidance about next visits/weeks of pregnancy given. --GBS and GCC today  --Pt planning an unassisted home birth. She looked into homebirth midwives in the area but no one is accepting new patients right now.   --Discussed home birth as safe option with appropriate screening and with appropriate professionals available for emergencies.  I do not recommend unassisted homebirth.   --Pt to continue  care in our office and can present to hospital for birth if desired or if any complications with homebirth.  --Labor readiness including [employer], evening primrose oil, and raspberry leaf tea discussed and info printed for pt.  2. [redacted] weeks gestation of pregnancy   Term labor symptoms and general obstetric precautions including but not limited to vaginal bleeding, contractions, leaking of fluid and fetal movement were reviewed in detail with the patient. Please refer to After Visit Summary for other counseling recommendations.   Return in about 1 week (around 08/05/2020).  Future Appointments  Date Time Provider Department Center  08/06/2020  8:35 AM [place], Wilmer Floor, CNM [facility] None    [name], CNM

## 2020-07-29 NOTE — Patient Instructions (Signed)
Things to Try After 37 weeks to Encourage Labor/Get Ready for Labor:   1.  Try the Miles Circuit at www.milescircuit.com daily to improve baby's position and encourage the onset of labor.  2. Walk a little and rest a little every day.  Change positions often.  3. Cervical Ripening: May try one or both a. Red Raspberry Leaf capsules or tea:  two 300mg or 400mg tablets with each meal, 2-3 times a day, or 1-3 cups of tea daily  Potential Side Effects Of Raspberry Leaf:  Most women do not experience any side effects from drinking raspberry leaf tea. However, nausea and loose stools are possible   b. Evening Primrose Oil capsules: take 1 capsule by mouth and place one capsule in the vagina every night.    Some of the potential side effects:  Upset stomach  Loose stools or diarrhea  Headaches  Nausea  4. Sex can also help the cervix ripen and encourage labor onset.    Labor Precautions Reasons to come to [facility]U at [facility]:  1.  Contractions are  5 minutes apart or less, each last 1 minute, these have been going on for 1-2 hours, and you cannot walk or talk during them 2.  You have a large gush of fluid, or a trickle of fluid that will not stop and you have to wear a pad 3.  You have bleeding that is bright red, heavier than spotting--like menstrual bleeding (spotting can be normal in early labor or after a check of your cervix) 4.  You do not feel the baby moving like he/she normally does 

## 2020-07-30 LAB — CERVICOVAGINAL ANCILLARY ONLY
Chlamydia: NEGATIVE
Comment: NEGATIVE
Comment: NORMAL
Neisseria Gonorrhea: NEGATIVE

## 2020-08-02 LAB — CULTURE, BETA STREP (GROUP B ONLY): Strep Gp B Culture: NEGATIVE

## 2020-08-06 ENCOUNTER — Ambulatory Visit (INDEPENDENT_AMBULATORY_CARE_PROVIDER_SITE_OTHER): Payer: POS | Admitting: Advanced Practice Midwife

## 2020-08-06 ENCOUNTER — Other Ambulatory Visit: Payer: Self-pay

## 2020-08-06 VITALS — BP 112/69 | HR 84 | Wt 238.0 lb

## 2020-08-06 DIAGNOSIS — Z3A38 38 weeks gestation of pregnancy: Secondary | ICD-10-CM

## 2020-08-06 DIAGNOSIS — Z3480 Encounter for supervision of other normal pregnancy, unspecified trimester: Secondary | ICD-10-CM

## 2020-08-06 NOTE — Progress Notes (Signed)
ROB, c/o pressure, not sure if she is LOF

## 2020-08-06 NOTE — Progress Notes (Signed)
   PRENATAL VISIT NOTE  Subjective:  Shannon Rivas is a 35 y.o. G2P1001 at [redacted]w[redacted]d being seen today for ongoing prenatal care.  She is currently monitored for the following issues for this low-risk pregnancy and has Supervision of other normal pregnancy, antepartum on their problem list.  Patient reports occasional contractions.  Contractions: Irregular. Vag. Bleeding: None.  Movement: Present. Denies leaking of fluid.   The following portions of the patient's history were reviewed and updated as appropriate: allergies, current medications, past family history, past medical history, past social history, past surgical history and problem list.   Objective:   Vitals:   08/06/20 0902  BP: 112/69  Pulse: 84  Weight: 238 lb (108 kg)    Fetal Status: Fetal Heart Rate (bpm): 141 Fundal Height: 37 cm Movement: Present  Presentation: Vertex  General:  Alert, oriented and cooperative. Patient is in no acute distress.  Skin: Skin is warm and dry. No rash noted.   Cardiovascular: Normal heart rate noted  Respiratory: Normal respiratory effort, no problems with respiration noted  Abdomen: Soft, gravid, appropriate for gestational age.  Pain/Pressure: Present     Pelvic: Cervical exam performed in the presence of a chaperone Dilation: 2 Effacement (%): 50 Station: -2  Extremities: Normal range of motion.  Edema: None  Mental Status: Normal mood and affect. Normal behavior. Normal judgment and thought content.   Assessment and Plan:  Pregnancy: G2P1001 at [redacted]w[redacted]d 1. Supervision of other normal pregnancy, antepartum --Anticipatory guidance about next visits/weeks of pregnancy given. --Cervical exam at pt request, 2/50/-2, vertex, cervix soft and more anterior than exam last week --Pt still planning unassisted homebirth.  Plans to return next week for visit, discussed sweeping membranes at 39 and/or at 40 weeks.  Pt to think about. --Pt reports she lost her mucus plug and had mucus discharge  after. No leakage requiring a pad, no leakage today. --Reviewed signs of labor/reasons to come to hospital  2. [redacted] weeks gestation of pregnancy   Term labor symptoms and general obstetric precautions including but not limited to vaginal bleeding, contractions, leaking of fluid and fetal movement were reviewed in detail with the patient. Please refer to After Visit Summary for other counseling recommendations.   Return in about 1 week (around 08/13/2020).  No future appointments.  [name], CNM

## 2020-08-06 NOTE — Patient Instructions (Signed)
Things to Try After 37 weeks to Encourage Labor/Get Ready for Labor:   1.  Try the Miles Circuit at www.milescircuit.com daily to improve baby's position and encourage the onset of labor.  2. Walk a little and rest a little every day.  Change positions often.  3. Cervical Ripening: May try one or both a. Red Raspberry Leaf capsules or tea:  two 300mg or 400mg tablets with each meal, 2-3 times a day, or 1-3 cups of tea daily  Potential Side Effects Of Raspberry Leaf:  Most women do not experience any side effects from drinking raspberry leaf tea. However, nausea and loose stools are possible   b. Evening Primrose Oil capsules: take 1 capsule by mouth and place one capsule in the vagina every night.    Some of the potential side effects:  Upset stomach  Loose stools or diarrhea  Headaches  Nausea  4. Sex can also help the cervix ripen and encourage labor onset.    Labor Precautions Reasons to come to [facility]U at [facility]:  1.  Contractions are  5 minutes apart or less, each last 1 minute, these have been going on for 1-2 hours, and you cannot walk or talk during them 2.  You have a large gush of fluid, or a trickle of fluid that will not stop and you have to wear a pad 3.  You have bleeding that is bright red, heavier than spotting--like menstrual bleeding (spotting can be normal in early labor or after a check of your cervix) 4.  You do not feel the baby moving like he/she normally does 

## 2020-08-09 ENCOUNTER — Telehealth: Payer: Self-pay

## 2020-08-09 NOTE — Telephone Encounter (Signed)
Called to advise that paperwork for FOB is complete, no answer, left vm

## 2020-08-12 ENCOUNTER — Encounter: Payer: POS | Admitting: Advanced Practice Midwife

## 2020-08-13 ENCOUNTER — Encounter: Payer: POS | Admitting: Nurse Practitioner

## 2020-08-19 ENCOUNTER — Ambulatory Visit (INDEPENDENT_AMBULATORY_CARE_PROVIDER_SITE_OTHER): Payer: POS | Admitting: Advanced Practice Midwife

## 2020-08-19 ENCOUNTER — Other Ambulatory Visit: Payer: Self-pay

## 2020-08-19 DIAGNOSIS — O165 Unspecified maternal hypertension, complicating the puerperium: Secondary | ICD-10-CM | POA: Diagnosis not present

## 2020-08-19 DIAGNOSIS — R87810 Cervical high risk human papillomavirus (HPV) DNA test positive: Secondary | ICD-10-CM

## 2020-08-19 DIAGNOSIS — R87610 Atypical squamous cells of undetermined significance on cytologic smear of cervix (ASC-US): Secondary | ICD-10-CM

## 2020-08-19 MED ORDER — AMLODIPINE BESYLATE 5 MG PO TABS: 5.0000 mg | ORAL_TABLET | Freq: Every day | ORAL | 2 refills | Status: DC

## 2020-08-19 NOTE — Progress Notes (Signed)
PP after SVD at home unassisted on 08/15/20 BP elevated norvasc and PEC labs, BP check in 1 week   GYNECOLOGY PROGRESS NOTE  History:  35 y.o. G2P1001 presents to [place] office today at time of her scheduled OB visit but she is now PPD#4 following SVD at home. She had planned unassisted homebirth and delivered without complication at home.  She kept her appointment today because she has swelling of both legs and did not have this with her first pregnancy.  She denies any h/a, epigastric pain, or visual disturbances.    The following portions of the patient's history were reviewed and updated as appropriate: allergies, current medications, past family history, past medical history, past social history, past surgical history and problem list. Last pap smear on 04/23/20 was abnormal with ASCUS and hpv positive.   Review of Systems:  Pertinent items are noted in HPI.   Objective:  Physical Exam Last menstrual period 11/22/2019.    VS reviewed, nursing note reviewed,  Constitutional: well developed, well nourished, no distress HEENT: normocephalic HEART: normal rate, heart sounds, regular rhythm RESP: normal effort, lung sounds clear and equal bilaterally Breast Exam: deferred Abdomen: soft Neuro: alert and oriented x 3 Skin: warm, dry Psych: affect normal Pelvic exam: Deferred  Assessment & Plan:   1. Postpartum care following vaginal delivery --Pt doing well following homebirth.   --Will return in 1 week for BP check, see below, and in 4 weeks for PP visit  2. Postpartum hypertension --Pt with mild range BP in office today, no s/sx of PEC, no hx HTN. --Discussed with pt that at hospital discharge, low dose BP medication is recommended to prevent readmission to the hospital. For the same reasons, I am recommending PEC labs and that we start medications today and recheck BP.  Pt agrees with POC. - CBC - Comp Met (CMET) - Protein / creatinine ratio, urine - amLODipine  (NORVASC) 5 MG tablet; Take 1 tablet (5 mg total) by mouth daily.  Dispense: 30 tablet; Refill: 2  3. ASCUS with positive high risk HPV cervical --Colpo recommended after abnormal Pap in September but was not performed during pregnancy. --Pt needs colpo, can be done at [facility] visit with appropriate provider, or at separate visit as desired  [name], CNM 1:31 PM

## 2020-08-19 NOTE — Progress Notes (Unsigned)
Pt  delivered vaginally 08/15/2020 at home.  Pt had water birth with just her and her spouse.   Per pt baby is doing well. Pt is breastfeeding   CC: swelling in both legs onset x 2 days ago.    Appt type has to be changed. Was originally scheduled as ROB .  Wt:240.6 lb  B/P141/91 P:80

## 2020-08-20 ENCOUNTER — Encounter: Payer: Self-pay | Admitting: Advanced Practice Midwife

## 2020-08-20 DIAGNOSIS — R87610 Atypical squamous cells of undetermined significance on cytologic smear of cervix (ASC-US): Secondary | ICD-10-CM | POA: Insufficient documentation

## 2020-08-20 LAB — COMPREHENSIVE METABOLIC PANEL
ALT: 19 IU/L (ref 0–32)
AST: 22 IU/L (ref 0–40)
Albumin/Globulin Ratio: 0.8 — ABNORMAL LOW (ref 1.2–2.2)
Albumin: 2.7 g/dL — ABNORMAL LOW (ref 3.8–4.8)
Alkaline Phosphatase: 161 IU/L — ABNORMAL HIGH (ref 44–121)
BUN/Creatinine Ratio: 12 (ref 9–23)
BUN: 8 mg/dL (ref 6–20)
Bilirubin Total: <0.2 mg/dL (ref 0.0–1.2)
CO2: 22 mmol/L (ref 20–29)
Calcium: 8.9 mg/dL (ref 8.7–10.2)
Chloride: 108 mmol/L — ABNORMAL HIGH (ref 96–106)
Creatinine, Ser: 0.69 mg/dL (ref 0.57–1.00)
GFR calc Af Amer: 132 mL/min/{1.73_m2} (ref >59)
GFR calc non Af Amer: 115 mL/min/{1.73_m2} (ref >59)
Globulin, Total: 3.3 g/dL (ref 1.5–4.5)
Glucose: 82 mg/dL (ref 65–99)
Potassium: 4.4 mmol/L (ref 3.5–5.2)
Sodium: 141 mmol/L (ref 134–144)
Total Protein: 6 g/dL (ref 6.0–8.5)

## 2020-08-20 LAB — CBC
Hematocrit: 30.9 % — ABNORMAL LOW (ref 34.0–46.6)
Hemoglobin: 10.2 g/dL — ABNORMAL LOW (ref 11.1–15.9)
MCH: 27.3 pg (ref 26.6–33.0)
MCHC: 33 g/dL (ref 31.5–35.7)
MCV: 83 fL (ref 79–97)
Platelets: 290 10*3/uL (ref 150–450)
RBC: 3.73 x10E6/uL — ABNORMAL LOW (ref 3.77–5.28)
RDW: 13.1 % (ref 11.7–15.4)
WBC: 8.5 10*3/uL (ref 3.4–10.8)

## 2020-08-20 LAB — PROTEIN / CREATININE RATIO, URINE
Creatinine, Urine: 147.5 mg/dL
Protein, Ur: 14.7 mg/dL
Protein/Creat Ratio: 100 mg/g creat (ref 0–200)

## 2020-08-26 ENCOUNTER — Ambulatory Visit (INDEPENDENT_AMBULATORY_CARE_PROVIDER_SITE_OTHER): Payer: POS

## 2020-08-26 ENCOUNTER — Other Ambulatory Visit: Payer: Self-pay

## 2020-08-26 VITALS — BP 118/70 | HR 60 | Wt 236.0 lb

## 2020-08-26 DIAGNOSIS — Z0130 Encounter for examination of blood pressure without abnormal findings: Secondary | ICD-10-CM | POA: Diagnosis not present

## 2020-08-26 NOTE — Progress Notes (Signed)
Subjective:  Shannon Rivas is a 35 y.o. female 1 Week PP here for BP check.   Hypertension ROS: taking medications as instructed, no medication side effects noted, no TIA's, no chest pain on exertion, no dyspnea on exertion and no swelling of ankles.    Objective:  BP 118/70   Pulse 60   Wt 236 lb (107 kg)   LMP 11/22/2019 (Within Days)   Breastfeeding Yes   BMI 41.81 kg/m   Appearance alert, well appearing, and in no distress. General exam BP noted to be well controlled today in office.    Assessment:   Blood Pressure well controlled.   Plan:  Current treatment plan is effective, no change in therapy. per [name]. Return in 1 week for BP.

## 2020-08-27 NOTE — Progress Notes (Signed)
Patient was assessed and managed by nursing staff during this encounter. I have reviewed the chart and agree with the documentation and plan. I have also made any necessary editorial changes.  [name], CNM 08/27/2020 1:16 PM

## 2020-09-02 ENCOUNTER — Ambulatory Visit: Payer: POS | Admitting: *Deleted

## 2020-09-02 ENCOUNTER — Other Ambulatory Visit: Payer: Self-pay

## 2020-09-02 VITALS — BP 127/78 | HR 74

## 2020-09-02 DIAGNOSIS — O165 Unspecified maternal hypertension, complicating the puerperium: Secondary | ICD-10-CM

## 2020-09-02 NOTE — Progress Notes (Signed)
Subjective:  Shannon Rivas is a 35 y.o. female here for BP check.   Hypertension ROS: taking medications as instructed, no medication side effects noted, no TIA's, no chest pain on exertion, no dyspnea on exertion and no swelling of ankles.    Objective:  BP 127/78   Pulse 74   LMP 11/22/2019 (Within Days)    Appearance alert, well appearing, and in no distress.  General exam BP noted to be well controlled today in office.    Assessment:   Blood Pressure well controlled.   Plan:  Per Misty Stanley L-K, pt may discontinue blood pressure Rx given normal PP blood pressures.  Pt to follow up for Routine PP visit.

## 2020-09-12 ENCOUNTER — Ambulatory Visit: Payer: POS | Admitting: Obstetrics and Gynecology

## 2020-09-16 ENCOUNTER — Other Ambulatory Visit: Payer: Self-pay

## 2020-09-16 ENCOUNTER — Ambulatory Visit (INDEPENDENT_AMBULATORY_CARE_PROVIDER_SITE_OTHER): Payer: POS | Admitting: Advanced Practice Midwife

## 2020-09-16 NOTE — Progress Notes (Signed)
Post Partum Visit Note  Shannon Rivas is a 35 y.o. G1P2002 female who presents for a postpartum visit. She is 4 weeks postpartum following a normal spontaneous vaginal delivery.  I have fully reviewed the prenatal and intrapartum course. The delivery was at 39 gestational weeks.  Anesthesia: none. Postpartum course has been doing well. Baby is doing well. Baby is feeding by breast. Bleeding no bleeding. Bowel function is normal. Bladder function is normal. Patient is not sexually active. Contraception method is abstinence.   Postpartum depression screening: negative, score 0.   The pregnancy intention screening data noted above was reviewed. Potential methods of contraception were discussed. The patient elected to proceed with FAM or LAM.    Edinburgh Postnatal Depression Scale - 09/16/20 1413      Edinburgh Postnatal Depression Scale:  In the Past 7 Days   I have been able to laugh and see the funny side of things. 0    I have looked forward with enjoyment to things. 0    I have blamed myself unnecessarily when things went wrong. 0    I have been anxious or worried for no good reason. 0    I have felt scared or panicky for no good reason. 0    Things have been getting on top of me. 0    I have been so unhappy that I have had difficulty sleeping. 0    I have felt sad or miserable. 0    I have been so unhappy that I have been crying. 0    The thought of harming myself has occurred to me. 0    Edinburgh Postnatal Depression Scale Total 0            The following portions of the patient's history were reviewed and updated as appropriate: allergies, current medications, past family history, past medical history, past social history, past surgical history and problem list.  Review of Systems Pertinent items noted in HPI and remainder of comprehensive ROS otherwise negative.    Objective:  BP 110/69   Pulse 77   Wt 230 lb (104.3 kg)   LMP 11/22/2019 (Within Days)   BMI  40.74 kg/m    VS reviewed, nursing note reviewed,  Constitutional: well developed, well nourished, no distress HEENT: normocephalic CV: normal rate Pulm/chest wall: normal effort Abdomen: soft Neuro: alert and oriented x 3 Skin: warm, dry Psych: affect normal  Assessment:    Normal postpartum exam.   Plan:   Essential components of care per ACOG recommendations:  1.  Mood and well being: Patient with negative depression screening today. Reviewed local resources for support.  - Patient does not use tobacco.  - hx of drug use? No    2. Infant care and feeding:  -Patient currently breastmilk feeding? Yes  --If breastmilk feeding discussed return to work and pumping. If needed, patient was provided letter for work to allow for every 2-3 hr pumping breaks, and to be granted a private location to express breastmilk and refrigerated area to store breastmilk. Reviewed importance of draining breast regularly to support lactation. -Social determinants of health (SDOH) reviewed in EPIC. No concerns   3. Sexuality, contraception and birth spacing - Patient does not want to prevent pregnancy and would welcome a pregnancy in the next year.        4. Sleep and fatigue -Encouraged family/partner/community support of 4 hrs of uninterrupted sleep to help with mood and fatigue  5. Physical Recovery  -  Discussed patients delivery at home which was without complications - Perineal healing reviewed. Patient expressed understanding - Patient has urinary incontinence? No - Patient is safe to resume physical and sexual activity  6.  Health Maintenance - Last pap smear done 04/23/20 and was abnormal with ASCUS and hpv. --Needs to schedule Colpo  7. Chronic Disease --BP grossly normal today. Pt still taking Norvasc 5 mg daily.   --Stop Norvasc, BP check in 1 week, then follow up with PCP   [name], CNM [facility]

## 2020-09-23 ENCOUNTER — Ambulatory Visit: Payer: POS

## 2020-11-26 ENCOUNTER — Encounter: Payer: Self-pay | Admitting: Obstetrics

## 2020-12-02 ENCOUNTER — Encounter: Payer: POS | Admitting: Licensed Clinical Social Worker

## 2020-12-08 ENCOUNTER — Other Ambulatory Visit: Payer: Self-pay

## 2020-12-08 ENCOUNTER — Emergency Department (HOSPITAL_COMMUNITY)
Admission: EM | Admit: 2020-12-08 | Discharge: 2020-12-08 | Disposition: A | Discharge status: Home / Self Care | Payer: POS | Attending: Emergency Medicine | Admitting: Emergency Medicine

## 2020-12-08 DIAGNOSIS — Z87891 Personal history of nicotine dependence: Secondary | ICD-10-CM | POA: Diagnosis not present

## 2020-12-08 DIAGNOSIS — N644 Mastodynia: Secondary | ICD-10-CM | POA: Insufficient documentation

## 2020-12-08 NOTE — ED Provider Notes (Signed)
[facility] EMERGENCY DEPARTMENT Provider Note   CSN: [id number] Arrival date & time: 12/08/20  0200     History Chief Complaint  Patient presents with  . Breast Pain    Shannon Rivas is a 35 y.o. female.  Patient presents to the emergency department with a chief complaint of bilateral breast pain.  She states that over the past several days, she has not been able to pump sufficiently while at work.  She states that her breasts have become engorged.  She states that she is concerned about mastitis.  She denies any fever, discharge, redness.  She states that her infant is still 100% breast-fed.  She states she would like a note or documentation recommending regular pumping while at work.  The history is provided by the patient. No language interpreter was used.       Past Medical History:  Diagnosis Date  . Obesity     Patient Active Problem List   Diagnosis Date Noted  . ASCUS with positive high risk HPV cervical 08/20/2020  . Supervision of other normal pregnancy, antepartum 04/16/2020    Past Surgical History:  Procedure Laterality Date  . NO PAST SURGERIES       OB History    Gravida  2   Para  2   Term  2   Preterm      AB      Living  2     SAB      IAB      Ectopic      Multiple      Live Births  2        Obstetric Comments  Planned unassisted homebirth        Family History  Problem Relation Age of Onset  . Diabetes Mother   . Hypertension Mother   . Thyroid disease Mother   . Ulcers Father   . Cancer Maternal Grandmother   . Diabetes Maternal Grandmother   . Hypertension Maternal Grandmother   . COPD Maternal Grandmother   . Cancer Maternal Grandfather     Social History   Tobacco Use  . Smoking status: Former Smoker    Types: Cigarettes  . Smokeless tobacco: Never Used  . Tobacco comment: no smoking for 4 years+  Vaping Use  . Vaping Use: Never used  Substance Use Topics  . Alcohol use: Not  Currently    Comment: socially   . Drug use: No    Home Medications Prior to Admission medications   Medication Sig Start Date End Date Taking? Authorizing Provider  amLODipine (NORVASC) 5 MG tablet Take 1 tablet (5 mg total) by mouth daily. 08/19/20   Leftwich-Kirby, Wilmer Floor, CNM  Elastic Bandages & Supports (COMFORT FIT MATERNITY SUPP MED) MISC 1 Device by Does not apply route daily. Patient not taking: Reported on 08/19/2020 06/11/20   Hurshel Party, CNM  ferrous sulfate 325 (65 FE) MG tablet Take 1 tablet (325 mg total) by mouth 2 (two) times daily with a meal. 04/24/20   Brock Bad, MD  Misc. Devices (BREAST PUMP) MISC 1 Device by Does not apply route daily. Patient not taking: Reported on 08/19/2020 06/11/20   Leftwich-Kirby, Misty Stanley A, CNM  Prenat-FeAsp-Meth-FA-DHA w/o A (PRENATE PIXIE) 10-0.6-0.4-200 MG CAPS Take 1 capsule by mouth daily. 04/16/20   Constant, Peggy, MD  Prenatal Vit-Fe Fumarate-FA (PREPLUS) 27-1 MG TABS Take 1 tablet by mouth daily. 02/08/20   Calvert Cantor, CNM  Allergies    Patient has no known allergies.  Review of Systems   Review of Systems  All other systems reviewed and are negative.   Physical Exam Updated Vital Signs BP 110/74 (BP Location: Right Arm)   Pulse 77   Temp 98.4 F (36.9 C)   Resp 17   SpO2 100%   Physical Exam Vitals and nursing note reviewed.  Constitutional:      General: She is not in acute distress.    Appearance: She is well-developed.  HENT:     Head: Normocephalic and atraumatic.  Eyes:     Conjunctiva/sclera: Conjunctivae normal.  Cardiovascular:     Rate and Rhythm: Normal rate.     Heart sounds: No murmur heard.     Comments: Female RN chaperone present for breast exam No discharge from the nipples, no erythema, no discharge Pulmonary:     Effort: Pulmonary effort is normal. No respiratory distress.  Abdominal:     General: There is no distension.  Musculoskeletal:     Cervical back: Neck  supple.     Comments: Moves all extremities  Skin:    General: Skin is warm and dry.  Neurological:     Mental Status: She is alert and oriented to person, place, and time.  Psychiatric:        Mood and Affect: Mood normal.        Behavior: Behavior normal.     ED Results / Procedures / Treatments   Labs (all labs ordered are listed, but only abnormal results are displayed) Labs Reviewed - No data to display  EKG None  Radiology No results found.  Procedures Procedures   Medications Ordered in ED Medications - No data to display  ED Course  I have reviewed the triage vital signs and the nursing notes.  Pertinent labs & imaging results that were available during my care of the patient were reviewed by me and considered in my medical decision making (see chart for details).    MDM Rules/Calculators/A&P                          Patient here with bilateral breast pain.  She has no evidence of mastitis.  She does feel like her breasts are engorged from not pumping adequately.  We discussed pumping at work and at home.  Return precautions discussed.  Patient understands agrees with the plan. Final Clinical Impression(s) / ED Diagnoses Final diagnoses:  Breast pain    Rx / DC Orders ED Discharge Orders    None       Roxy Horseman, PA-C 12/08/20 8502    Zadie Rhine, MD 12/08/20 [phone]

## 2020-12-08 NOTE — Discharge Instructions (Addendum)
You should continue to pump on your regular schedule (approx every 2 hours) so that your milk supply doesn't decrease.  If you notice fever, redness, or discharge, you should be seen by your doctor.

## 2020-12-08 NOTE — ED Triage Notes (Signed)
Pt states she has not been able to pump adequately while at work and feels like her breasts have gotten engorged. Pt concerned about decreased milk supply. Pt denies fevers, no redness noted.

## 2021-05-21 ENCOUNTER — Other Ambulatory Visit: Payer: Self-pay

## 2021-05-21 ENCOUNTER — Emergency Department (HOSPITAL_COMMUNITY)
Admission: EM | Admit: 2021-05-21 | Discharge: 2021-05-21 | Disposition: A | Discharge status: Home / Self Care | Payer: POS | Attending: Emergency Medicine | Admitting: Emergency Medicine

## 2021-05-21 DIAGNOSIS — R42 Dizziness and giddiness: Secondary | ICD-10-CM | POA: Diagnosis not present

## 2021-05-21 DIAGNOSIS — Z5321 Procedure and treatment not carried out due to patient leaving prior to being seen by health care provider: Secondary | ICD-10-CM | POA: Insufficient documentation

## 2021-05-21 DIAGNOSIS — N938 Other specified abnormal uterine and vaginal bleeding: Secondary | ICD-10-CM | POA: Diagnosis present

## 2021-05-21 LAB — COMPREHENSIVE METABOLIC PANEL
ALT: 26 U/L (ref 0–44)
AST: 41 U/L (ref 15–41)
Albumin: 3.3 g/dL — ABNORMAL LOW (ref 3.5–5.0)
Alkaline Phosphatase: 98 U/L (ref 38–126)
Anion gap: 6 (ref 5–15)
BUN: 9 mg/dL (ref 6–20)
CO2: 25 mmol/L (ref 22–32)
Calcium: 8.8 mg/dL — ABNORMAL LOW (ref 8.9–10.3)
Chloride: 103 mmol/L (ref 98–111)
Creatinine, Ser: 0.83 mg/dL (ref 0.44–1.00)
GFR, Estimated: >60 mL/min (ref >60)
Glucose, Bld: 92 mg/dL (ref 70–99)
Potassium: 3.8 mmol/L (ref 3.5–5.1)
Sodium: 134 mmol/L — ABNORMAL LOW (ref 135–145)
Total Bilirubin: 0.4 mg/dL (ref 0.3–1.2)
Total Protein: 6.8 g/dL (ref 6.5–8.1)

## 2021-05-21 LAB — TYPE AND SCREEN
ABO/RH(D): O POS
Antibody Screen: NEGATIVE

## 2021-05-21 LAB — I-STAT BETA HCG BLOOD, ED (MC, WL, AP ONLY): I-stat hCG, quantitative: <5 m[IU]/mL (ref <5)

## 2021-05-21 NOTE — ED Triage Notes (Signed)
Pt c/o vaginal bleeding with large clots x2 weeks. States she has had to wear briefs. Has changed brief every 4 hours. Denies pain. Also c/o dizziness, no syncopal episodes. Pt has vaginal birth approx 8 months at home delivery.

## 2021-05-21 NOTE — ED Notes (Signed)
Pt is in consultation room pumping

## 2021-05-21 NOTE — ED Notes (Signed)
Patient states she is leaving d/t wait time 

## 2021-05-21 NOTE — ED Provider Notes (Signed)
Emergency Medicine Provider Triage Evaluation Note  Shannon Rivas , a 35 y.o. female  was evaluated in triage.  Pt complains of vaginal bleeding x2 weeks.  Patient had vaginal delivery at home about 8 months ago, and has concerns that she "did not get all of her placenta out".  She is having to wear diapers during the day, and changing them every 4 hours.  She denies any abdominal pain or pelvic pain.  States she has a history of iron deficiency anemia, and takes iron replacements daily.  Review of Systems  Positive: Vaginal bleeding, dizziness, weakness Negative: Abdominal pain, pelvic pain, syncope, dysuria, diarrhea, blood in stool  Physical Exam  BP 110/70 (BP Location: Left Arm)   Pulse 72   Temp 98.2 F (36.8 C) (Oral)   Resp 18   Ht 5\' 3"  (1.6 m)   Wt 101.2 kg   SpO2 100%   BMI 39.50 kg/m  Gen:   Awake, no distress   Resp:  Normal effort  MSK:   Moves extremities without difficulty  Other:    Medical Decision Making  Medically screening exam initiated at 8:49 PM.  Appropriate orders placed.  LILLEE MOONEYHAN was informed that the remainder of the evaluation will be completed by another provider, this initial triage assessment does not replace that evaluation, and the importance of remaining in the ED until their evaluation is complete.     Stephanie Acre 05/21/21 2050    05/23/21, MD 05/21/21 [phone]

## 2021-05-22 LAB — ABO/RH: ABO/RH(D): O POS

## 2021-05-24 ENCOUNTER — Emergency Department (HOSPITAL_COMMUNITY)
Admission: EM | Admit: 2021-05-24 | Discharge: 2021-05-25 | Disposition: A | Discharge status: Home / Self Care | Payer: POS | Attending: Emergency Medicine | Admitting: Emergency Medicine

## 2021-05-24 ENCOUNTER — Other Ambulatory Visit: Payer: Self-pay

## 2021-05-24 DIAGNOSIS — N939 Abnormal uterine and vaginal bleeding, unspecified: Secondary | ICD-10-CM | POA: Insufficient documentation

## 2021-05-24 DIAGNOSIS — Z87891 Personal history of nicotine dependence: Secondary | ICD-10-CM | POA: Diagnosis not present

## 2021-05-24 LAB — CBC WITH DIFFERENTIAL/PLATELET
Abs Immature Granulocytes: 0.02 10*3/uL (ref 0.00–0.07)
Basophils Absolute: 0 10*3/uL (ref 0.0–0.1)
Basophils Relative: 1 %
Eosinophils Absolute: 0.1 10*3/uL (ref 0.0–0.5)
Eosinophils Relative: 1 %
HCT: 31.4 % — ABNORMAL LOW (ref 36.0–46.0)
Hemoglobin: 9.9 g/dL — ABNORMAL LOW (ref 12.0–15.0)
Immature Granulocytes: 0 %
Lymphocytes Relative: 43 %
Lymphs Abs: 3.3 10*3/uL (ref 0.7–4.0)
MCH: 26.7 pg (ref 26.0–34.0)
MCHC: 31.5 g/dL (ref 30.0–36.0)
MCV: 84.6 fL (ref 80.0–100.0)
Monocytes Absolute: 0.5 10*3/uL (ref 0.1–1.0)
Monocytes Relative: 7 %
Neutro Abs: 3.8 10*3/uL (ref 1.7–7.7)
Neutrophils Relative %: 48 %
Platelets: 375 10*3/uL (ref 150–400)
RBC: 3.71 MIL/uL — ABNORMAL LOW (ref 3.87–5.11)
RDW: 13.8 % (ref 11.5–15.5)
WBC: 7.7 10*3/uL (ref 4.0–10.5)
nRBC: 0 % (ref 0.0–0.2)

## 2021-05-24 LAB — BASIC METABOLIC PANEL
Anion gap: 4 — ABNORMAL LOW (ref 5–15)
BUN: 7 mg/dL (ref 6–20)
CO2: 26 mmol/L (ref 22–32)
Calcium: 8.8 mg/dL — ABNORMAL LOW (ref 8.9–10.3)
Chloride: 106 mmol/L (ref 98–111)
Creatinine, Ser: 0.96 mg/dL (ref 0.44–1.00)
GFR, Estimated: >60 mL/min (ref >60)
Glucose, Bld: 95 mg/dL (ref 70–99)
Potassium: 4 mmol/L (ref 3.5–5.1)
Sodium: 136 mmol/L (ref 135–145)

## 2021-05-24 LAB — SAMPLE TO BLOOD BANK

## 2021-05-24 LAB — I-STAT BETA HCG BLOOD, ED (MC, WL, AP ONLY): I-stat hCG, quantitative: <5 m[IU]/mL (ref <5)

## 2021-05-24 LAB — PROTIME-INR
INR: 1.2 (ref 0.8–1.2)
Prothrombin Time: 14.8 seconds (ref 11.4–15.2)

## 2021-05-24 NOTE — ED Triage Notes (Signed)
Patient reports heavy menstrual bleeding for several weeks .

## 2021-05-24 NOTE — ED Notes (Signed)
PT in consultation room pumping. PT advised to let the Sort NT know when she was finished and we would triage her at that point

## 2021-05-24 NOTE — ED Provider Notes (Signed)
Emergency Medicine Provider Triage Evaluation Note  Shannon Rivas , a 35 y.o. female  was evaluated in triage.  Pt complains of vaginal bleeding.  This has been ongoing for several weeks.  She delivered at home in end of January.  She reports that she had one day of bleeding and otherwise hasn't had a cycle really since her delivery.  She reports that she reports that the bleeding is staying heavy over the past few weeks.   Review of Systems  Positive: Vaginal bleeding Negative: Fevers, syncope  Physical Exam  BP 115/79 (BP Location: Left Arm)   Pulse 82   Temp 98.7 F (37.1 C) (Oral)   Resp 16   Ht 5\' 3"  (1.6 m)   Wt 110 kg   SpO2 97%   BMI 42.96 kg/m  Gen:   Awake, no distress   Resp:  Normal effort  MSK:   Moves extremities without difficulty  Other:  Normal speech.   Medical Decision Making  Medically screening exam initiated at 10:18 PM.  Appropriate orders placed.  Shannon Rivas was informed that the remainder of the evaluation will be completed by another provider, this initial triage assessment does not replace that evaluation, and the importance of remaining in the ED until their evaluation is complete.  Note: Portions of this report may have been transcribed using voice recognition software. Every effort was made to ensure accuracy; however, inadvertent computerized transcription errors may be present    Stephanie Acre 05/24/21 2223    2224, MD 05/25/21 [phone]

## 2021-05-25 MED ORDER — MEDROXYPROGESTERONE ACETATE 10 MG PO TABS: 20.0000 mg | ORAL_TABLET | Freq: Every day | ORAL | 0 refills | Status: DC

## 2021-05-25 NOTE — ED Notes (Signed)
Patient reevaluated by PA at triage .  

## 2021-05-25 NOTE — ED Provider Notes (Signed)
Asked by RN to reevaluate the patient as there was concern that patient's blood pressure was now 95/50 and she was here for vaginal bleeding.  There was concern that she was 2 weeks postpartum.  I immediately saw and evaluated the patient after asked I was pulled aside and received request from staff.  Patient clarifies that she has not 2 weeks postpartum.  She delivered in January 2022.  She has a history of irregular menstrual cycles.  She has been bleeding through approximately 1 pad an hour.  She was wearing Pampers diapers, but ran out of these.  She denies feeling increasingly short of breath or lightheaded.  No syncope.  Repeat blood pressure is 129/78.  She does not clinically appear changed from last evaluation.  She can be returned to the waiting room until an acute room is open for further work-up and evaluation.  Discussed this plan with the patient who is in agreement.   Barkley Boards, PA-C 05/25/21 0002    Marily Memos, MD 05/25/21 [phone]

## 2021-05-25 NOTE — Discharge Instructions (Addendum)
You are seen in the ER for vaginal bleeding.  Spoke with the gynecology team.  They recommend that you start taking the medication that is prescribed and follow-up with [facility] for further care.

## 2021-06-04 ENCOUNTER — Ambulatory Visit: Payer: POS | Admitting: Obstetrics

## 2021-06-05 ENCOUNTER — Ambulatory Visit: Payer: POS | Admitting: Obstetrics

## 2021-06-10 NOTE — ED Provider Notes (Signed)
[facility] EMERGENCY DEPARTMENT Provider Note   CSN: [id number] Arrival date & time: 05/24/21  1923     History Chief Complaint  Patient presents with   Vaginal Bleeding    Shannon Rivas is a 35 y.o. female.  HPI     35 year old female comes in with chief complaint of vaginal bleeding.  Patient reports heavy menstrual bleeding for the last several weeks.  She is supposed to follow-up with gynecology soon.  She is denying any abdominal pain.  She was seeing gynecologist in the past for menorrhagia and was prescribed medications.  Patient is several months postpartum and initially thought that the bleeding was her period, but the bleeding has not stopped which got her concerned and brought her to the ER.  She denies any dizziness but does indicate that she feels weak.  Past Medical History:  Diagnosis Date   Obesity     Patient Active Problem List   Diagnosis Date Noted   ASCUS with positive high risk HPV cervical 08/20/2020   Supervision of other normal pregnancy, antepartum 04/16/2020    Past Surgical History:  Procedure Laterality Date   NO PAST SURGERIES       OB History     Gravida  2   Para  2   Term  2   Preterm      AB      Living  2      SAB      IAB      Ectopic      Multiple      Live Births  2        Obstetric Comments  Planned unassisted homebirth         Family History  Problem Relation Age of Onset   Diabetes Mother    Hypertension Mother    Thyroid disease Mother    Ulcers Father    Cancer Maternal Grandmother    Diabetes Maternal Grandmother    Hypertension Maternal Grandmother    COPD Maternal Grandmother    Cancer Maternal Grandfather     Social History   Tobacco Use   Smoking status: Former    Types: Cigarettes   Smokeless tobacco: Never   Tobacco comments:    no smoking for 4 years+  Vaping Use   Vaping Use: Never used  Substance Use Topics   Alcohol use: Not Currently     Comment: socially    Drug use: No    Home Medications Prior to Admission medications   Medication Sig Start Date End Date Taking? Authorizing Provider  medroxyPROGESTERone (PROVERA) 10 MG tablet Take 2 tablets (20 mg total) by mouth daily. 05/25/21  Yes Keriann Rankin, MD  amLODipine (NORVASC) 5 MG tablet Take 1 tablet (5 mg total) by mouth daily. 08/19/20   Leftwich-Kirby, Wilmer Floor, CNM  Elastic Bandages & Supports (COMFORT FIT MATERNITY SUPP MED) MISC 1 Device by Does not apply route daily. Patient not taking: Reported on 08/19/2020 06/11/20   Hurshel Party, CNM  ferrous sulfate 325 (65 FE) MG tablet Take 1 tablet (325 mg total) by mouth 2 (two) times daily with a meal. 04/24/20   Brock Bad, MD  Misc. Devices (BREAST PUMP) MISC 1 Device by Does not apply route daily. Patient not taking: Reported on 08/19/2020 06/11/20   Leftwich-Kirby, Misty Stanley A, CNM  Prenat-FeAsp-Meth-FA-DHA w/o A (PRENATE PIXIE) 10-0.6-0.4-200 MG CAPS Take 1 capsule by mouth daily. 04/16/20   Constant, Gigi Gin, MD  Prenatal Vit-Fe  Fumarate-FA (PREPLUS) 27-1 MG TABS Take 1 tablet by mouth daily. 02/08/20   Calvert Cantor, CNM    Allergies    Patient has no known allergies.  Review of Systems   Review of Systems  Constitutional:  Positive for activity change and fatigue.  Respiratory:  Negative for shortness of breath.   Neurological:  Negative for dizziness.  Hematological:  Does not bruise/bleed easily.  All other systems reviewed and are negative.  Physical Exam Updated Vital Signs BP 113/62   Pulse 88   Temp 98.3 F (36.8 C) (Oral)   Resp 16   Ht 5\' 3"  (1.6 m)   Wt 110 kg   SpO2 98%   BMI 42.96 kg/m   Physical Exam Vitals and nursing note reviewed.  Constitutional:      Appearance: She is well-developed.  HENT:     Head: Atraumatic.  Cardiovascular:     Rate and Rhythm: Normal rate.  Pulmonary:     Effort: Pulmonary effort is normal.  Musculoskeletal:     Cervical back: Normal  range of motion and neck supple.  Skin:    General: Skin is warm and dry.  Neurological:     Mental Status: She is alert and oriented to person, place, and time.    ED Results / Procedures / Treatments   Labs (all labs ordered are listed, but only abnormal results are displayed) Labs Reviewed  CBC WITH DIFFERENTIAL/PLATELET - Abnormal; Notable for the following components:      Result Value   RBC 3.71 (*)    Hemoglobin 9.9 (*)    HCT 31.4 (*)    All other components within normal limits  BASIC METABOLIC PANEL - Abnormal; Notable for the following components:   Calcium 8.8 (*)    Anion gap 4 (*)    All other components within normal limits  PROTIME-INR  I-STAT BETA HCG BLOOD, ED ([facility], [facility], [facility] ONLY)  SAMPLE TO BLOOD BANK    EKG None  Radiology No results found.  Procedures Procedures   Medications Ordered in ED Medications - No data to display  ED Course  I have reviewed the triage vital signs and the nursing notes.  Pertinent labs & imaging results that were available during my care of the patient were reviewed by me and considered in my medical decision making (see chart for details).    MDM Rules/Calculators/A&P                           35 year old female comes in with chief complaint of vaginal bleeding.  She is postpartum from earlier this year and reports that she thought she was having periods -but her bleeding has not stopped.  Labs are reassuring.  We did discuss with gynecologist and has started on her progesterone.  Strict ER return precautions have been discussed, and patient is agreeing with the plan and is comfortable with the workup done and the recommendations from the ER.   Final Clinical Impression(s) / ED Diagnoses Final diagnoses:  Vaginal bleeding  Abnormal vaginal bleeding    Rx / DC Orders ED Discharge Orders          Ordered    medroxyPROGESTERone (PROVERA) 10 MG tablet  Daily        05/25/21 0748             05/27/21, MD 06/10/21 1601

## 2021-07-26 ENCOUNTER — Other Ambulatory Visit: Payer: Self-pay | Admitting: Obstetrics and Gynecology

## 2021-10-18 ENCOUNTER — Observation Stay (HOSPITAL_BASED_OUTPATIENT_CLINIC_OR_DEPARTMENT_OTHER)
Admission: EM | Admit: 2021-10-18 | Discharge: 2021-10-19 | Disposition: A | Discharge status: Home / Self Care | Payer: POS | Attending: Internal Medicine | Admitting: Internal Medicine

## 2021-10-18 ENCOUNTER — Emergency Department (HOSPITAL_BASED_OUTPATIENT_CLINIC_OR_DEPARTMENT_OTHER): Payer: POS

## 2021-10-18 ENCOUNTER — Encounter (HOSPITAL_BASED_OUTPATIENT_CLINIC_OR_DEPARTMENT_OTHER): Payer: Self-pay

## 2021-10-18 ENCOUNTER — Other Ambulatory Visit: Payer: Self-pay

## 2021-10-18 DIAGNOSIS — Z79899 Other long term (current) drug therapy: Secondary | ICD-10-CM | POA: Diagnosis not present

## 2021-10-18 DIAGNOSIS — N920 Excessive and frequent menstruation with regular cycle: Secondary | ICD-10-CM | POA: Diagnosis not present

## 2021-10-18 DIAGNOSIS — Z87891 Personal history of nicotine dependence: Secondary | ICD-10-CM | POA: Diagnosis not present

## 2021-10-18 DIAGNOSIS — N924 Excessive bleeding in the premenopausal period: Secondary | ICD-10-CM | POA: Diagnosis present

## 2021-10-18 DIAGNOSIS — Z6841 Body Mass Index (BMI) 40.0 and over, adult: Secondary | ICD-10-CM | POA: Diagnosis not present

## 2021-10-18 DIAGNOSIS — D649 Anemia, unspecified: Principal | ICD-10-CM | POA: Diagnosis present

## 2021-10-18 DIAGNOSIS — R109 Unspecified abdominal pain: Secondary | ICD-10-CM | POA: Diagnosis not present

## 2021-10-18 DIAGNOSIS — O99019 Anemia complicating pregnancy, unspecified trimester: Secondary | ICD-10-CM

## 2021-10-18 DIAGNOSIS — R9389 Abnormal findings on diagnostic imaging of other specified body structures: Secondary | ICD-10-CM | POA: Diagnosis present

## 2021-10-18 DIAGNOSIS — N939 Abnormal uterine and vaginal bleeding, unspecified: Secondary | ICD-10-CM

## 2021-10-18 HISTORY — DX: Anemia, unspecified: D64.9

## 2021-10-18 LAB — CBC WITH DIFFERENTIAL/PLATELET
Abs Immature Granulocytes: 0.03 10*3/uL (ref 0.00–0.07)
Basophils Absolute: 0 10*3/uL (ref 0.0–0.1)
Basophils Relative: 0 %
Eosinophils Absolute: 0.1 10*3/uL (ref 0.0–0.5)
Eosinophils Relative: 1 %
HCT: 18.9 % — ABNORMAL LOW (ref 36.0–46.0)
Hemoglobin: 5.1 g/dL — CL (ref 12.0–15.0)
Immature Granulocytes: 0 %
Lymphocytes Relative: 37 %
Lymphs Abs: 3.4 10*3/uL (ref 0.7–4.0)
MCH: 17.1 pg — ABNORMAL LOW (ref 26.0–34.0)
MCHC: 27 g/dL — ABNORMAL LOW (ref 30.0–36.0)
MCV: 63.4 fL — ABNORMAL LOW (ref 80.0–100.0)
Monocytes Absolute: 0.7 10*3/uL (ref 0.1–1.0)
Monocytes Relative: 8 %
Neutro Abs: 4.8 10*3/uL (ref 1.7–7.7)
Neutrophils Relative %: 54 %
Platelets: 378 10*3/uL (ref 150–400)
RBC: 2.98 MIL/uL — ABNORMAL LOW (ref 3.87–5.11)
RDW: 20.1 % — ABNORMAL HIGH (ref 11.5–15.5)
WBC: 9 10*3/uL (ref 4.0–10.5)
nRBC: 0 % (ref 0.0–0.2)

## 2021-10-18 LAB — IRON AND TIBC
Iron: 9 ug/dL — ABNORMAL LOW (ref 28–170)
Saturation Ratios: 2 % — ABNORMAL LOW (ref 10.4–31.8)
TIBC: 420 ug/dL (ref 250–450)
UIBC: 411 ug/dL

## 2021-10-18 LAB — BASIC METABOLIC PANEL
Anion gap: 9 (ref 5–15)
BUN: 11 mg/dL (ref 6–20)
CO2: 22 mmol/L (ref 22–32)
Calcium: 8.9 mg/dL (ref 8.9–10.3)
Chloride: 104 mmol/L (ref 98–111)
Creatinine, Ser: 0.83 mg/dL (ref 0.44–1.00)
GFR, Estimated: >60 mL/min (ref >60)
Glucose, Bld: 93 mg/dL (ref 70–99)
Potassium: 3.7 mmol/L (ref 3.5–5.1)
Sodium: 135 mmol/L (ref 135–145)

## 2021-10-18 LAB — VITAMIN B12: Vitamin B-12: 507 pg/mL (ref 180–914)

## 2021-10-18 LAB — PREPARE RBC (CROSSMATCH)

## 2021-10-18 LAB — FOLATE: Folate: 29.7 ng/mL (ref >5.9)

## 2021-10-18 LAB — PREGNANCY, URINE: Preg Test, Ur: NEGATIVE

## 2021-10-18 LAB — FERRITIN: Ferritin: 3 ng/mL — ABNORMAL LOW (ref 11–307)

## 2021-10-18 MED ORDER — SENNOSIDES-DOCUSATE SODIUM 8.6-50 MG PO TABS: 1.0000 | ORAL_TABLET | Freq: Every evening | ORAL | Status: DC | PRN

## 2021-10-18 MED ORDER — MEGESTROL ACETATE 40 MG PO TABS: 40.0000 mg | ORAL_TABLET | Freq: Two times a day (BID) | ORAL | Status: DC

## 2021-10-18 MED ORDER — ONDANSETRON HCL 4 MG/2ML IJ SOLN: 4.0000 mg | Freq: Four times a day (QID) | INTRAMUSCULAR | Status: DC | PRN

## 2021-10-18 MED ORDER — ACETAMINOPHEN 650 MG RE SUPP: 650.0000 mg | Freq: Four times a day (QID) | RECTAL | Status: DC | PRN

## 2021-10-18 MED ORDER — MEGESTROL ACETATE 40 MG PO TABS
40.0000 mg | ORAL_TABLET | Freq: Two times a day (BID) | ORAL | Status: DC
  Administered 2021-10-18 – 2021-10-19 (×2): 40 mg via ORAL
  Filled 2021-10-18 (×3): qty 1

## 2021-10-18 MED ORDER — SODIUM CHLORIDE 0.9% IV SOLUTION: Freq: Once | INTRAVENOUS | Status: AC

## 2021-10-18 MED ORDER — ONDANSETRON HCL 4 MG PO TABS: 4.0000 mg | ORAL_TABLET | Freq: Four times a day (QID) | ORAL | Status: DC | PRN

## 2021-10-18 MED ORDER — ACETAMINOPHEN 325 MG PO TABS: 650.0000 mg | ORAL_TABLET | Freq: Four times a day (QID) | ORAL | Status: DC | PRN

## 2021-10-18 NOTE — ED Triage Notes (Signed)
C/o vaginal bleeding x 3 months. States has been feeling weak and dizzy x 2 weeks. Hgb 4.9 yesterday at PCP. Hx anemia but normally Hgb of 9. ?

## 2021-10-18 NOTE — ED Notes (Signed)
Pt transported to [facility]

## 2021-10-18 NOTE — H&P (Signed)
?History and Physical  ? ? Shannon Rivas TDS:[id number] DOB: 22-Apr-1986 DOA: 10/18/2021 ? ?PCP: [facility]  ?Patient coming from: [facility] ? ?I have personally briefly reviewed patient's old medical records in [facility] ? ?Chief Complaint: Heavy vaginal bleeding, dizziness ? ?HPI: ?Shannon Rivas is a 36 y.o. female with medical history significant for iron deficiency anemia and obesity who presented to the ED for evaluation of heavy vaginal bleeding with generalized weakness and dizziness. ? ?Patient reports a chronic history of irregular and heavy menses.  She says however over the last 3 months she has been having continuous heavy bleeding.  She has been having to wear depends due to amount of bleeding.  She has been feeling generally weak and has been experiencing exertional dyspnea and lightheadedness which is new for her.  She reports usual abdominal cramping with menses without any worsening or change in abdominal pain.  She has noticed that she has been craving ice recently.  She went to her PCP on 3/24 and was found to have a hemoglobin of 4.9 and advised to come to the ED for further evaluation. ? ?She was previously seen in the ED in October 2022 for heavy bleeding at which time her hemoglobin was 9.9.  She was prescribed medroxyprogesterone however she states she did not start it as she was breast-feeding at that time and was concerned about potential side effect. ? ?She denies any other obvious bleeding.  She has not on any blood thinners and denies frequent use of NSAIDs. ? ?ED Course  Labs/Imaging on admission: I have personally reviewed following labs and imaging studies. ? ?Initial vitals showed BP 109/52, pulse 90, RR 17, temp 98.3 ?F, SPO2 100% on room air. ? ?Labs show hemoglobin 5.1, hematocrit 18.9, MCV 63.4, RDW 20.1, platelets 378,000, WBC 9.0, sodium 135, potassium 3.7, bicarb 22, BUN 11, creatinine 0.83.  Urine pregnancy test  negative. ? ?Pelvic ultrasound shows multiple subcentimeter follicles in the periphery of both ovaries suggesting possible polycystic ovarian disease.  Inhomogenous echogenicity and myometrium without discrete nodule seen.  Endometrial stripe is prominent measuring 12 mm without definite focal abnormality. ? ?EDP discussed with on-call OB/GYN, Dr. Donavan Foil, who recommended Megace 40 mg twice daily, blood transfusion, and outpatient follow-up.  The hospitalist service was consulted to transfer for admission as there is no capability of blood transfusion at [facility]. ? ?Review of Systems: All systems reviewed and are negative except as documented in history of present illness above. ? ? ?Past Medical History:  ?Diagnosis Date  ? Anemia   ? Obesity   ? ? ?Past Surgical History:  ?Procedure Laterality Date  ? NO PAST SURGERIES    ? ? ?Social History: ? reports that she has quit smoking. Her smoking use included cigarettes. She has never used smokeless tobacco. She reports that she does not currently use alcohol. She reports that she does not use drugs. ? ?No Known Allergies ? ?Family History  ?Problem Relation Age of Onset  ? Diabetes Mother   ? Hypertension Mother   ? Thyroid disease Mother   ? Ulcers Father   ? Cancer Maternal Grandmother   ? Diabetes Maternal Grandmother   ? Hypertension Maternal Grandmother   ? COPD Maternal Grandmother   ? Cancer Maternal Grandfather   ? ? ? ?Prior to Admission medications   ?Medication Sig Start Date End Date Taking? Authorizing Provider  ?amLODipine (NORVASC) 5 MG tablet Take 1 tablet (5  mg total) by mouth daily. 08/19/20   Leftwich-Kirby, Wilmer FloorLisa A, CNM  ?Elastic Bandages & Supports (COMFORT FIT MATERNITY SUPP MED) MISC 1 Device by Does not apply route daily. ?Patient not taking: Reported on 08/19/2020 06/11/20   Hurshel PartyLeftwich-Kirby, Lisa A, CNM  ?ferrous sulfate 325 (65 FE) MG tablet Take 1 tablet (325 mg total) by mouth 2 (two) times daily with a meal. 04/24/20   Brock BadHarper, Charles  A, MD  ?medroxyPROGESTERone (PROVERA) 10 MG tablet Take 2 tablets (20 mg total) by mouth daily. 05/25/21   Derwood KaplanNanavati, Ankit, MD  ?Misc. Devices (BREAST PUMP) MISC 1 Device by Does not apply route daily. ?Patient not taking: Reported on 08/19/2020 06/11/20   Hurshel PartyLeftwich-Kirby, Lisa A, CNM  ?Prenat-FeAsp-Meth-FA-DHA w/o A (PRENATE PIXIE) 10-0.6-0.4-200 MG CAPS Take 1 capsule by mouth daily. 04/16/20   Constant, Peggy, MD  ?Prenatal Vit-Fe Fumarate-FA (PREPLUS) 27-1 MG TABS Take 1 tablet by mouth daily. 02/08/20   Calvert CantorWeinhold, Samantha C, CNM  ? ? ?Physical Exam: ?Vitals:  ? 10/18/21 1415 10/18/21 1430 10/18/21 1435 10/18/21 1814  ?BP:   (!) 106/58 111/61  ?Pulse: 88 86 88 81  ?Resp: (!) 26 (!) 21 20 17   ?Temp:    98 ?F (36.7 ?C)  ?TempSrc:    Oral  ?SpO2: 100% 100% 100% 100%  ?Weight:      ?Height:      ? ?Constitutional: NAD, calm, comfortable ?Eyes: PERRL, lids and conjunctivae normal ?ENMT: Mucous membranes are moist. Posterior pharynx clear of any exudate or lesions.Normal dentition.  ?Neck: normal, supple, no masses. ?Respiratory: clear to auscultation bilaterally, no wheezing, no crackles. Normal respiratory effort. No accessory muscle use.  ?Cardiovascular: Regular rate and rhythm, no murmurs / rubs / gallops. No extremity edema. 2+ pedal pulses. ?Abdomen: no tenderness, no masses palpated. No hepatosplenomegaly. Bowel sounds positive.  ?Musculoskeletal: no clubbing / cyanosis. No joint deformity upper and lower extremities. Good ROM, no contractures. Normal muscle tone.  ?Skin: no rashes, lesions, ulcers. No induration ?Neurologic: CN 2-12 grossly intact. Sensation intact. Strength 5/5 in all 4.  ?Psychiatric: Normal judgment and insight. Alert and oriented x 3. Normal mood.  ? ?EKG: Personally reviewed. Sinus tachycardia, rate 103, nonspecific T wave change in the inferior leads.  No prior for comparison. ? ?Assessment/Plan ?Principal Problem: ?  Symptomatic anemia ?Active Problems: ?  Abnormal vaginal bleeding with  endometrial thickness less than 16 mm present on transvaginal ultrasound in premenopausal patient ?  ?Stephanie AcreJasmine R Archambault is a 36 y.o. female with medical history significant for iron deficiency anemia and obesity who is admitted with symptomatic anemia secondary to abnormal vaginal bleeding. ? ?Assessment and Plan: ?* Symptomatic anemia ?Secondary to heavy vaginal bleeding ongoing last 3 months.  Hemoglobin 5.1 on admission.  Most likely iron deficient as well.  Pelvic ultrasound with possible polycystic ovary disease, inhomogenous echogenicity and myometrium, prominent endometrial stripe measuring 12 mm without definite focal abnormality. ?-Obtain anemia panel ?-Transfuse 2 unit PRBC ?-Repeat CBC in a.m. ?-Start on Megace 40 mg twice daily and follow-up with OB/GYN outpatient per their recommendations ? ?DVT prophylaxis: SCDs Start: 10/18/21 1859 ?Code Status: Full code ?Family Communication: Discussed with patient, she has discussed with family ?Disposition Plan: From home and likely discharge to home in 1 day ?Consults called: EDP discussed with on-call OB/GYN ?Severity of Illness: ?The appropriate patient status for this patient is OBSERVATION. Observation status is judged to be reasonable and necessary in order to provide the required intensity of service to ensure the patient's safety. The  patient's presenting symptoms, physical exam findings, and initial radiographic and laboratory data in the context of their medical condition is felt to place them at decreased risk for further clinical deterioration. Furthermore, it is anticipated that the patient will be medically stable for discharge from the hospital within 2 midnights of admission.   ?Darreld Mclean MD ?[facility] ? ?If 7PM-7AM, please contact night-coverage ?[email or web address] ? ?10/18/2021, 7:11 PM  ?

## 2021-10-18 NOTE — ED Notes (Signed)
Report called to RN.

## 2021-10-18 NOTE — Assessment & Plan Note (Signed)
Secondary to heavy vaginal bleeding ongoing last 3 months.  Hemoglobin 5.1 on admission.  Most likely iron deficient as well.  Pelvic ultrasound with possible polycystic ovary disease, inhomogenous echogenicity and myometrium, prominent endometrial stripe measuring 12 mm without definite focal abnormality. ?-Obtain anemia panel ?-Transfuse 2 unit PRBC ?-Repeat CBC in a.m. ?-Start on Megace 40 mg twice daily and follow-up with OB/GYN outpatient per their recommendations ?

## 2021-10-18 NOTE — ED Provider Notes (Signed)
?[facility] EMERGENCY DEPARTMENT ?Provider Note ? ? ?CSN: [id number] ?Arrival date & time: 10/18/21  1150 ? ?  ? ?History ?Chief Complaint  ?Patient presents with  ? Abnormal Lab  ? Vaginal Bleeding  ? ? ?Shannon Rivas is a 36 y.o. female who presents the emergency department with symptomatic anemia.  Patient states that she has been having heavy vaginal bleeding for approximately 3 months. Was seen and evaluated at her PCP yesterday and was noted to have a hemoglobin of 4.9 (baseline of around 9).  She has not been seen or evaluated by OB/GYN.  Patient was seen and evaluated in the emergency department late last year and was given a combo OCP but did not take it as she was breast-feeding.  She states that she was not explained about the side effects of breast-feeding with the medication and she ultimately decided not to take it.  She states that her vaginal bleeding subsided for 1 week and started again a couple of days ago.  Over the last 2 weeks she has been having a lot of lightheadedness, generalized weakness, fatigue, exertional dyspnea, and palpitations.  She states that she has been having a lot of heavy clots as well.  She does report mild abdominal cramping. ? ? ?Abnormal Lab ?Vaginal Bleeding ? ?  ? ?Home Medications ?Prior to Admission medications   ?Medication Sig Start Date End Date Taking? Authorizing Provider  ?amLODipine (NORVASC) 5 MG tablet Take 1 tablet (5 mg total) by mouth daily. 08/19/20   Leftwich-Kirby, Wilmer Floor, CNM  ?Elastic Bandages & Supports (COMFORT FIT MATERNITY SUPP MED) MISC 1 Device by Does not apply route daily. ?Patient not taking: Reported on 08/19/2020 06/11/20   Hurshel Party, CNM  ?ferrous sulfate 325 (65 FE) MG tablet Take 1 tablet (325 mg total) by mouth 2 (two) times daily with a meal. 04/24/20   Brock Bad, MD  ?medroxyPROGESTERone (PROVERA) 10 MG tablet Take 2 tablets (20 mg total) by mouth daily. 05/25/21   Derwood Kaplan, MD  ?Misc. Devices  (BREAST PUMP) MISC 1 Device by Does not apply route daily. ?Patient not taking: Reported on 08/19/2020 06/11/20   Hurshel Party, CNM  ?Prenat-FeAsp-Meth-FA-DHA w/o A (PRENATE PIXIE) 10-0.6-0.4-200 MG CAPS Take 1 capsule by mouth daily. 04/16/20   Constant, Peggy, MD  ?Prenatal Vit-Fe Fumarate-FA (PREPLUS) 27-1 MG TABS Take 1 tablet by mouth daily. 02/08/20   Calvert Cantor, CNM  ?   ? ?Allergies    ?Patient has no known allergies.   ? ?Review of Systems   ?Review of Systems  ?Genitourinary:  Positive for vaginal bleeding.  ?All other systems reviewed and are negative. ? ?Physical Exam ?Updated Vital Signs ?BP (!) 106/58   Pulse 88   Temp 98.3 ?F (36.8 ?C) (Oral)   Resp 20   Ht 5\' 3"  (1.6 m)   Wt 109.3 kg   SpO2 100%   BMI 42.69 kg/m?  ?Physical Exam ?Vitals and nursing note reviewed.  ?Constitutional:   ?   General: She is not in acute distress. ?   Appearance: Normal appearance.  ?HENT:  ?   Head: Normocephalic and atraumatic.  ?Eyes:  ?   General:     ?   Right eye: No discharge.     ?   Left eye: No discharge.  ?   Comments: Pale conjunctiva.  ?Cardiovascular:  ?   Comments: Regular rate and rhythm.  S1/S2 are distinct without any evidence of murmur, rubs,  or gallops.  Radial pulses are 2+ bilaterally.  Dorsalis pedis pulses are 2+ bilaterally.  No evidence of pedal edema. ?Pulmonary:  ?   Comments: Clear to auscultation bilaterally.  Normal effort.  No respiratory distress.  No evidence of wheezes, rales, or rhonchi heard throughout. ?Abdominal:  ?   General: Abdomen is flat. Bowel sounds are normal. There is no distension.  ?   Tenderness: There is no abdominal tenderness. There is no guarding or rebound.  ?Musculoskeletal:     ?   General: Normal range of motion.  ?   Cervical back: Neck supple.  ?Skin: ?   General: Skin is warm and dry.  ?   Findings: No rash.  ?Neurological:  ?   General: No focal deficit present.  ?   Mental Status: She is alert.  ?Psychiatric:     ?   Mood and Affect:  Mood normal.     ?   Behavior: Behavior normal.  ? ? ?ED Results / Procedures / Treatments   ?Labs ?(all labs ordered are listed, but only abnormal results are displayed) ?Labs Reviewed  ?CBC WITH DIFFERENTIAL/PLATELET - Abnormal; Notable for the following components:  ?    Result Value  ? RBC 2.98 (*)   ? Hemoglobin 5.1 (*)   ? HCT 18.9 (*)   ? MCV 63.4 (*)   ? MCH 17.1 (*)   ? MCHC 27.0 (*)   ? RDW 20.1 (*)   ? All other components within normal limits  ?BASIC METABOLIC PANEL  ?PREGNANCY, URINE  ? ? ?EKG ?EKG Interpretation ? ?Date/Time:  Saturday October 18 2021 12:05:32 EDT ?Ventricular Rate:  103 ?PR Interval:  142 ?QRS Duration: 80 ?QT Interval:  340 ?QTC Calculation: 445 ?R Axis:   70 ?Text Interpretation: Sinus tachycardia Nonspecific T wave abnormality Abnormal ECG No previous ECGs available Confirmed by Alona Bene [phone]) on 10/18/2021 12:22:56 PM ? ?Radiology ?US PELVIC COMPLETE W TRANSVAGINAL AND TORSION R/O ? ?Result Date: 10/18/2021 ?CLINICAL DATA:  Vaginal bleeding EXAM: TRANSABDOMINAL AND TRANSVAGINAL ULTRASOUND OF PELVIS DOPPLER ULTRASOUND OF OVARIES TECHNIQUE: Both transabdominal and transvaginal ultrasound examinations of the pelvis were performed. Transabdominal technique was performed for global imaging of the pelvis including uterus, ovaries, adnexal regions, and pelvic cul-de-sac. It was necessary to proceed with endovaginal exam following the transabdominal exam to visualize the endometrium and ovaries. Color and duplex Doppler ultrasound was utilized to evaluate blood flow to the ovaries. COMPARISON:  None. FINDINGS: Uterus Measurements: 8 x 6.3 x 6.2 cm = volume: 166.8 mL. Uterus is retroverted. There is inhomogeneous echogenicity in myometrium without discrete nodules. Endometrium Thickness: 12 mm.  No focal abnormality visualized. Right ovary Measurements: 3.7 x 1.8 x 2.1 cm = volume: 7.6 mL. There are multiple subcentimeter follicles in the periphery. Left ovary Measurements: 3.3 x 2.5 x  2.7 cm = volume: 12.4 mL. There are multiple subcentimeter follicles in the periphery. Pulsed Doppler evaluation of both ovaries demonstrates normal low-resistance arterial and venous waveforms. Other findings No abnormal free fluid. IMPRESSION: There are multiple subcentimeter follicles in the periphery of both ovaries suggesting possible polycystic ovary disease. There is inhomogeneous echogenicity in myometrium without discrete nodules. Endometrial stripe is prominent measuring 12 mm without definite focal abnormality. Electronically Signed   By: Ernie Avena M.D.   On: 10/18/2021 14:28   ? ?Procedures ?Procedures  ? ? ?Medications Ordered in ED ?Medications - No data to display ? ?ED Course/ Medical Decision Making/ A&P ?Clinical Course as of 10/18/21 1621  ?  Sat Oct 18, 2021  ?1455 I spoke with Dr. Donavan FoilBass with OBGYN who recommended Megace 40mg  BID and monitor response. Can increased to 80mg  BID. After stabilization and blood counts come up she can follow up in the outpatient setting.  [CF]  ?1616 I spoke with Dr. Jonathon BellowsKc with [facility] who agrees to admit the patient. [CF]  ?  ?Clinical Course User Index ?[CF] Honor LohFleming, Eleri Ruben M, PA-C  ? ?                        ?Medical Decision Making ?Amount and/or Complexity of Data Reviewed ?Labs: ordered. ?Radiology: ordered. ? ?Risk ?Decision regarding hospitalization. ? ? ?This patient presents to the ED for concern of anemia likely secondary to vaginal bleeding, this involves an extensive number of treatment options, and is a complaint that carries with it a high risk of complications and morbidity.  The differential diagnosis includes leiomyoma, fibroids, polyps. ? ? ?Co morbidities that complicate the patient evaluation ? ?Past Medical History:  ?Diagnosis Date  ? Anemia   ? Obesity   ? ? ?Additional history obtained: ? ?Additional history obtained from nursing note ? ? ?Lab Tests: ? ?I Ordered, and personally interpreted labs.  The pertinent results include:  CBC reveals profound anemia.  BMP is normal. ? ? ?Imaging Studies ordered: ? ?I ordered imaging studies including ultrasound pelvic ?I independently visualized and interpreted imaging which showed no obvious uterin

## 2021-10-18 NOTE — Hospital Course (Signed)
Shannon Rivas is a 36 y.o. female with medical history significant for iron deficiency anemia and obesity who is admitted with symptomatic anemia secondary to abnormal vaginal bleeding. ?

## 2021-10-19 DIAGNOSIS — D649 Anemia, unspecified: Secondary | ICD-10-CM | POA: Diagnosis not present

## 2021-10-19 LAB — CBC
HCT: 23.6 % — ABNORMAL LOW (ref 36.0–46.0)
HCT: 26.4 % — ABNORMAL LOW (ref 36.0–46.0)
Hemoglobin: 6.8 g/dL — CL (ref 12.0–15.0)
Hemoglobin: 7.9 g/dL — ABNORMAL LOW (ref 12.0–15.0)
MCH: 20.1 pg — ABNORMAL LOW (ref 26.0–34.0)
MCH: 21.6 pg — ABNORMAL LOW (ref 26.0–34.0)
MCHC: 28.8 g/dL — ABNORMAL LOW (ref 30.0–36.0)
MCHC: 29.9 g/dL — ABNORMAL LOW (ref 30.0–36.0)
MCV: 69.6 fL — ABNORMAL LOW (ref 80.0–100.0)
MCV: 72.1 fL — ABNORMAL LOW (ref 80.0–100.0)
Platelets: 331 10*3/uL (ref 150–400)
Platelets: 319 10*3/uL (ref 150–400)
RBC: 3.39 MIL/uL — ABNORMAL LOW (ref 3.87–5.11)
RBC: 3.66 MIL/uL — ABNORMAL LOW (ref 3.87–5.11)
RDW: 23.1 % — ABNORMAL HIGH (ref 11.5–15.5)
RDW: 24.3 % — ABNORMAL HIGH (ref 11.5–15.5)
WBC: 10 10*3/uL (ref 4.0–10.5)
WBC: 10 10*3/uL (ref 4.0–10.5)
nRBC: 0.2 % (ref 0.0–0.2)
nRBC: 0.2 % (ref 0.0–0.2)

## 2021-10-19 LAB — HIV ANTIBODY (ROUTINE TESTING W REFLEX): HIV Screen 4th Generation wRfx: NONREACTIVE

## 2021-10-19 LAB — BASIC METABOLIC PANEL
Anion gap: 6 (ref 5–15)
BUN: 11 mg/dL (ref 6–20)
CO2: 22 mmol/L (ref 22–32)
Calcium: 8.7 mg/dL — ABNORMAL LOW (ref 8.9–10.3)
Chloride: 107 mmol/L (ref 98–111)
Creatinine, Ser: 0.75 mg/dL (ref 0.44–1.00)
GFR, Estimated: >60 mL/min (ref >60)
Glucose, Bld: 98 mg/dL (ref 70–99)
Potassium: 3.8 mmol/L (ref 3.5–5.1)
Sodium: 135 mmol/L (ref 135–145)

## 2021-10-19 LAB — PREPARE RBC (CROSSMATCH)

## 2021-10-19 MED ORDER — DOCUSATE SODIUM 100 MG PO CAPS: 100.0000 mg | ORAL_CAPSULE | Freq: Every day | ORAL | 2 refills | Status: AC | PRN

## 2021-10-19 MED ORDER — SODIUM CHLORIDE 0.9 % IV SOLN
250.0000 mg | Freq: Once | INTRAVENOUS | Status: AC
  Administered 2021-10-19: 250 mg via INTRAVENOUS
  Filled 2021-10-19: qty 20

## 2021-10-19 MED ORDER — MEGESTROL ACETATE 20 MG PO TABS
20.0000 mg | ORAL_TABLET | Freq: Two times a day (BID) | ORAL | 0 refills | Status: AC
Start: 2021-10-19 — End: 2021-10-23

## 2021-10-19 MED ORDER — FERROUS SULFATE 325 (65 FE) MG PO TABS: 325.0000 mg | ORAL_TABLET | Freq: Every day | ORAL | 0 refills | Status: AC

## 2021-10-19 MED ORDER — SODIUM CHLORIDE 0.9% IV SOLUTION: Freq: Once | INTRAVENOUS | Status: AC

## 2021-10-19 MED ORDER — CYANOCOBALAMIN 1000 MCG/ML IJ SOLN
1000.0000 ug | Freq: Once | INTRAMUSCULAR | Status: AC
  Administered 2021-10-19: 1000 ug via SUBCUTANEOUS
  Filled 2021-10-19: qty 1

## 2021-10-19 MED ORDER — FOLIC ACID 1 MG PO TABS
1.0000 mg | ORAL_TABLET | Freq: Every day | ORAL | Status: DC
  Administered 2021-10-19: 1 mg via ORAL
  Filled 2021-10-19: qty 1

## 2021-10-19 NOTE — Progress Notes (Signed)
MD notified of critical HGB 6.8, orders received ? ?

## 2021-10-19 NOTE — Progress Notes (Signed)
PIV removed. Discharge instructions completed. Patient verbalized understanding of medication regimen, follow up appointments and discharge instructions. Patient belongings gathered and packed to discharge.  

## 2021-10-20 LAB — TYPE AND SCREEN
ABO/RH(D): O POS
Antibody Screen: NEGATIVE
Unit division: 0
Unit division: 0
Unit division: 0

## 2021-10-20 LAB — BPAM RBC
Blood Product Expiration Date: 202303292359
Blood Product Expiration Date: 202304192359
Blood Product Expiration Date: 202304212359
ISSUE DATE / TIME: 202303252228
ISSUE DATE / TIME: 202303260124
ISSUE DATE / TIME: 202303260959
Unit Type and Rh: 5100
Unit Type and Rh: 5100
Unit Type and Rh: 9500

## 2021-10-20 NOTE — Discharge Summary (Signed)
?Physician Discharge Summary ?  ?Patient: Shannon Rivas MRN: [id number] DOB: 1986/05/01  ?Admit date:     10/18/2021  ?Discharge date: 10/19/2021  ?Discharge Physician: Berle Mull  ?PCP: [facility] ? ?Recommendations at discharge: ?Follow-up with OB/GYN as recommended. ? ?Discharge Diagnoses: ?Principal Problem: ?  Symptomatic anemia ?Active Problems: ?  Abnormal vaginal bleeding with endometrial thickness less than 16 mm present on transvaginal ultrasound in premenopausal patient ? ? ?Hospital Course: ?Shannon Rivas is a 36 y.o. female with medical history significant for iron deficiency anemia and obesity who is admitted with symptomatic anemia secondary to abnormal vaginal bleeding. ? ?Assessment and Plan: ?* Symptomatic anemia ?Menorrhagia. ?Possible polycystic ovarian disease. ?Secondary to heavy vaginal bleeding ongoing last 3 months.  Hemoglobin 5.1 on admission.  Most likely iron deficient as well.  Pelvic ultrasound with possible polycystic ovary disease, inhomogenous echogenicity and myometrium, prominent endometrial stripe measuring 12 mm without definite focal abnormality. ?Patient was treated with 1 dose of IV Ferrlecit and B12 injection. ?SP PRBC transfusion.  Hemoglobin stable after transfusion. ?Patient is taking prenatal vitamin on a daily basis at home.  Recommend to add an extra iron tablets to that regimen. ?Recommend follow-up with OB/GYN doctors in the clinic. ?Patient was started on Megace for OB/GYN will reduce the dose to 20 mg daily with short course. ? ?Morbid obesity. ?Body mass index is 42.69 kg/m?Marland Kitchen  ?Placing the patient at high risk for poor outcome. ? ?Consultants: Phone consultation with GYN ?Procedures performed:  ?PRBC transfusion. ?DISCHARGE MEDICATION: ?Allergies as of 10/19/2021   ?No Known Allergies ?  ? ?  ?Medication List  ?  ? ?STOP taking these medications   ? ?amLODipine 5 MG tablet ?Commonly known as: Norvasc ?  ?[place] ?  ?medroxyPROGESTERone 10 MG tablet ?Commonly known as: PROVERA ?  ? ?  ? ?TAKE these medications   ? ?Breast Pump Misc ?1 Device by Does not apply route daily. ?  ?docusate sodium 100 MG capsule ?Commonly known as: Colace ?Take 1 capsule (100 mg total) by mouth daily as needed. ?  ?ferrous sulfate 325 (65 FE) MG tablet ?Take 1 tablet (325 mg total) by mouth daily with breakfast. ?What changed: when to take this ?  ?megestrol 20 MG tablet ?Commonly known as: MEGACE ?Take 1 tablet (20 mg total) by mouth 2 (two) times daily for 4 days. ?  ?Multivitamin Adult Tabs ?Take 1 tablet by mouth daily. ?  ?Prenate Pixie 10-0.6-0.4-200 MG Caps ?Take 1 capsule by mouth daily. ?  ?PrePLUS 27-1 MG Tabs ?Take 1 tablet by mouth daily. ?  ?PROBIOTIC PO ?Take 1 capsule by mouth daily. ?  ? ?  ? ? Follow-up Information   ? ? [facility]. Schedule an appointment as soon as possible for a visit in 1 week(s).   ?Contact information: ?[place] ?[place] [address] ?[phone] ? ? ?  ?  ? ? Griffin Basil, MD. Schedule an appointment as soon as possible for a visit in 2 week(s).   ?Specialty: Obstetrics and Gynecology ?Why: with CBC and BMP ?Contact information: ?[address] ?[place] [address] ?[phone] ? ? ?  ?  ? ?  ?  ? ?  ? ?Disposition: Home ?Diet recommendation:  ?Discharge Diet Orders (From admission, onward)  ? ?  Start     Ordered  ? 10/19/21 0000  Diet general       ? 10/19/21 1525  ? ?  ?  ? ?  ? ?  Discharge Exam: ?Danley Danker Weights  ? 10/18/21 1201  ?Weight: 109.3 kg  ? ?General: Appear in mild distress; no visible Abnormal Neck Mass Or lumps, Conjunctiva normal ?Cardiovascular: S1 and S2 Present, no Murmur, ?Respiratory: good respiratory effort, Bilateral Air entry present and CTA, no Crackles, no wheezes ?Abdomen: Bowel Sound present Non tender ?Extremities: no Pedal edema ?Neurology: alert and oriented to time, place, and person ?Gait not checked due to patient safety concerns  ? ?Condition  at discharge: good ? ?The results of significant diagnostics from this hospitalization (including imaging, microbiology, ancillary and laboratory) are listed below for reference.  ? ?Imaging Studies: ?US PELVIC COMPLETE W TRANSVAGINAL AND TORSION R/O ? ?Result Date: 10/18/2021 ?CLINICAL DATA:  Vaginal bleeding EXAM: TRANSABDOMINAL AND TRANSVAGINAL ULTRASOUND OF PELVIS DOPPLER ULTRASOUND OF OVARIES TECHNIQUE: Both transabdominal and transvaginal ultrasound examinations of the pelvis were performed. Transabdominal technique was performed for global imaging of the pelvis including uterus, ovaries, adnexal regions, and pelvic cul-de-sac. It was necessary to proceed with endovaginal exam following the transabdominal exam to visualize the endometrium and ovaries. Color and duplex Doppler ultrasound was utilized to evaluate blood flow to the ovaries. COMPARISON:  None. FINDINGS: Uterus Measurements: 8 x 6.3 x 6.2 cm = volume: 166.8 mL. Uterus is retroverted. There is inhomogeneous echogenicity in myometrium without discrete nodules. Endometrium Thickness: 12 mm.  No focal abnormality visualized. Right ovary Measurements: 3.7 x 1.8 x 2.1 cm = volume: 7.6 mL. There are multiple subcentimeter follicles in the periphery. Left ovary Measurements: 3.3 x 2.5 x 2.7 cm = volume: 12.4 mL. There are multiple subcentimeter follicles in the periphery. Pulsed Doppler evaluation of both ovaries demonstrates normal low-resistance arterial and venous waveforms. Other findings No abnormal free fluid. IMPRESSION: There are multiple subcentimeter follicles in the periphery of both ovaries suggesting possible polycystic ovary disease. There is inhomogeneous echogenicity in myometrium without discrete nodules. Endometrial stripe is prominent measuring 12 mm without definite focal abnormality. Electronically Signed   By: Elmer Picker M.D.   On: 10/18/2021 14:28   ? ?Microbiology: ?Results for orders placed or performed in visit on 07/29/20   ?Culture, beta strep (group b only)     Status: None  ? Collection Time: 07/29/20  2:53 AM  ? Specimen: Vaginal/Rectal; Genital  ? VR  ?Result Value Ref Range Status  ? Strep Gp B Culture Negative Negative Final  ?  Comment: Centers for Disease Control and Prevention (CDC) and American Congress ?of Obstetricians and Gynecologists (ACOG) guidelines for prevention of ?perinatal group B streptococcal (GBS) disease specify co-collection of ?a vaginal and rectal swab specimen to maximize sensitivity of GBS ?detection. Per the CDC and ACOG, swabbing both the lower vagina and ?rectum substantially increases the yield of detection compared with ?sampling the vagina alone. ?Penicillin G, ampicillin, or cefazolin are indicated for intrapartum ?prophylaxis of perinatal GBS colonization. Reflex susceptibility ?testing should be performed prior to use of clindamycin only on GBS ?isolates from penicillin-allergic women who are considered a high risk ?for anaphylaxis. Treatment with vancomycin without additional testing ?is warranted if resistance to clindamycin is noted. ?  ? ? ?Labs: ?CBC: ?Recent Labs  ?Lab 10/18/21 ?1226 10/19/21 ?0757 10/19/21 ?1427  ?WBC 9.0 10.0 10.0  ?NEUTROABS 4.8  --   --   ?HGB 5.1* 6.8* 7.9*  ?HCT 18.9* 23.6* 26.4*  ?MCV 63.4* 69.6* 72.1*  ?PLT 378 331 319  ? ?Basic Metabolic Panel: ?Recent Labs  ?Lab 10/18/21 ?1226 10/19/21 ?[id number]  ?NA 135 135  ?K 3.7 3.8  ?CL  104 107  ?CO2 22 22  ?GLUCOSE 93 98  ?BUN 11 11  ?CREATININE 0.83 0.75  ?CALCIUM 8.9 8.7*  ? ?Liver Function Tests: ?No results for input(s): AST, ALT, ALKPHOS, BILITOT, PROT, ALBUMIN in the last 168 hours. ?CBG: ?No results for input(s): GLUCAP in the last 168 hours. ? ?Discharge time spent: greater than 30 minutes. ? ?Signed: ?Berle Mull, MD ?[facility] 10/19/2021  ? ? ? ? ? ? ?  ?

## 2023-11-01 ENCOUNTER — Emergency Department (HOSPITAL_BASED_OUTPATIENT_CLINIC_OR_DEPARTMENT_OTHER): Admitting: Radiology

## 2023-11-01 ENCOUNTER — Encounter (HOSPITAL_BASED_OUTPATIENT_CLINIC_OR_DEPARTMENT_OTHER): Payer: Self-pay | Admitting: Emergency Medicine

## 2023-11-01 ENCOUNTER — Emergency Department (HOSPITAL_BASED_OUTPATIENT_CLINIC_OR_DEPARTMENT_OTHER)
Admission: EM | Admit: 2023-11-01 | Discharge: 2023-11-01 | Disposition: A | Discharge status: Home / Self Care | Payer: Worker's Compensation | Attending: Emergency Medicine | Admitting: Emergency Medicine

## 2023-11-01 ENCOUNTER — Other Ambulatory Visit: Payer: Self-pay

## 2023-11-01 DIAGNOSIS — M25521 Pain in right elbow: Secondary | ICD-10-CM | POA: Insufficient documentation

## 2023-11-01 DIAGNOSIS — S9001XA Contusion of right ankle, initial encounter: Secondary | ICD-10-CM | POA: Diagnosis not present

## 2023-11-01 DIAGNOSIS — Y9301 Activity, walking, marching and hiking: Secondary | ICD-10-CM | POA: Insufficient documentation

## 2023-11-01 DIAGNOSIS — W240XXA Contact with lifting devices, not elsewhere classified, initial encounter: Secondary | ICD-10-CM | POA: Insufficient documentation

## 2023-11-01 DIAGNOSIS — S8001XA Contusion of right knee, initial encounter: Secondary | ICD-10-CM | POA: Diagnosis present

## 2023-11-01 NOTE — ED Triage Notes (Signed)
 States was hit in R ankle over forklift on Saturday. Able to apply pressure when walking. Also endorses R knee pain too.

## 2023-11-01 NOTE — ED Notes (Signed)
 Discharge paperwork given and verbally understood.

## 2023-11-01 NOTE — ED Provider Notes (Signed)
 [place] EMERGENCY DEPARTMENT AT [facility] Provider Note   CSN: [id number] Arrival date & time: 11/01/23  1620     History  Chief Complaint  Patient presents with   Ankle Pain    Shannon Rivas is a 38 y.o. female.  Patient presents to the emergency department today for evaluation of injuries sustained after a fall occurring on 10/30/2023 and on 10/31/2023.  The initial injury was sustained when a blade from a forklift struck the outside of her right ankle.  This caused her to stumble.  She was able to ambulate afterwards and continued working.  The following day when she went into work she was walking down a set of cement stairs and states that her "ankle gave out".  This caused her to stumble forward and she had to awkwardly catch herself with the railing.  She struck the outside of her right knee and twisted her right elbow.  She states that the ankle pain is improving.  The knee pain is worse on the outside of the knee.  And she only feels the elbow pain when she extends her elbow.  No head or neck injury.  She is requesting something to put on her knee to help "stabilize it".       Home Medications Prior to Admission medications   Medication Sig Start Date End Date Taking? Authorizing Provider  ferrous sulfate 325 (65 FE) MG tablet Take 1 tablet (325 mg total) by mouth daily with breakfast. 10/19/21   Rolly Salter, MD  Misc. Devices (BREAST PUMP) MISC 1 Device by Does not apply route daily. Patient not taking: Reported on 08/19/2020 06/11/20   [name] A, CNM  Multiple Vitamin (MULTIVITAMIN ADULT) TABS Take 1 tablet by mouth daily.    [provider]  Prenat-FeAsp-Meth-FA-DHA w/o A (PRENATE PIXIE) 10-0.6-0.4-200 MG CAPS Take 1 capsule by mouth daily. Patient not taking: Reported on 10/18/2021 04/16/20   Constant, Peggy, MD  Prenatal Vit-Fe Fumarate-FA (PREPLUS) 27-1 MG TABS Take 1 tablet by mouth daily. Patient not taking: Reported on 10/18/2021  02/08/20   Calvert Cantor, CNM  Probiotic Product (PROBIOTIC PO) Take 1 capsule by mouth daily.    [provider]      Allergies    Patient has no known allergies.    Review of Systems   Review of Systems  Physical Exam Updated Vital Signs BP 104/67 (BP Location: Right Arm)   Pulse (!) 115   Temp 98.2 F (36.8 C)   Resp 16   Ht 5\' 3"  (1.6 m)   Wt 108.9 kg   SpO2 97%   BMI 42.51 kg/m   Physical Exam Vitals and nursing note reviewed.  Constitutional:      Appearance: She is well-developed.  HENT:     Head: Normocephalic and atraumatic.  Eyes:     Pupils: Pupils are equal, round, and reactive to light.  Cardiovascular:     Pulses: Normal pulses. No decreased pulses.          Dorsalis pedis pulses are 2+ on the right side and 2+ on the left side.  Musculoskeletal:        General: Tenderness present.     Right elbow: Normal range of motion. Tenderness present.     Cervical back: Normal range of motion and neck supple.     Right hip: Normal range of motion.     Right knee: Bony tenderness present. Normal range of motion. Tenderness present over the lateral  joint line.     Right lower leg: No tenderness or bony tenderness.     Right ankle: Tenderness present over the lateral malleolus. No medial malleolus tenderness. Normal range of motion.       Legs:     Comments: Right elbow tenderness mild, reports increased with extension of the elbow over the olecranon.  Skin:    General: Skin is warm and dry.  Neurological:     Mental Status: She is alert.     Sensory: No sensory deficit.     Comments: Motor, sensation, and vascular distal to the injury is fully intact.   Psychiatric:        Mood and Affect: Mood normal.     ED Results / Procedures / Treatments   Labs (all labs ordered are listed, but only abnormal results are displayed) Labs Reviewed - No data to display  EKG None  Radiology DG Elbow Complete Right Result Date: 11/01/2023 CLINICAL DATA:   Pain after fall. EXAM: RIGHT ELBOW - COMPLETE 3+ VIEW COMPARISON:  None Available. FINDINGS: There is no evidence of fracture, dislocation, or joint effusion. The alignment and joint spaces are preserved. There is no evidence of arthropathy or other focal bone abnormality. Soft tissues are unremarkable. IMPRESSION: Negative radiographs of the right elbow. Electronically Signed   By: Narda Rutherford M.D.   On: 11/01/2023 18:38   DG Ankle Complete Right Result Date: 11/01/2023 CLINICAL DATA:  Pain after injury. EXAM: RIGHT ANKLE - COMPLETE 3+ VIEW COMPARISON:  None Available. FINDINGS: There is no evidence of fracture, dislocation, or joint effusion. The ankle mortise is preserved. There is no evidence of arthropathy or other focal bone abnormality. Soft tissues are unremarkable. IMPRESSION: No fracture or dislocation of the right ankle. Electronically Signed   By: Narda Rutherford M.D.   On: 11/01/2023 18:37   DG Knee Complete 4 Views Right Result Date: 11/01/2023 CLINICAL DATA:  Pain after injury. EXAM: RIGHT KNEE - COMPLETE 4+ VIEW COMPARISON:  None Available. FINDINGS: No evidence of fracture, dislocation, or joint effusion. The alignment and joint spaces are normal. There is mild peripheral spurring of the medial tibiofemoral compartment. No erosions or focal bone abnormality. Soft tissues are unremarkable. IMPRESSION: 1. No fracture or subluxation of the right knee. 2. Mild degenerative spurring of the medial tibiofemoral compartment. Electronically Signed   By: Narda Rutherford M.D.   On: 11/01/2023 18:34    Procedures Procedures    Medications Ordered in ED Medications - No data to display  ED Course/ Medical Decision Making/ A&P    Patient seen and examined. History obtained directly from patient.   Labs/EKG: None ordered  Imaging: Ordered x-ray of the right ankle, right knee, right elbow.  Medications/Fluids: None ordered  Most recent vital signs reviewed and are as follows: BP  104/67 (BP Location: Right Arm)   Pulse (!) 115   Temp 98.2 F (36.8 C)   Resp 16   Ht 5\' 3"  (1.6 m)   Wt 108.9 kg   SpO2 97%   BMI 42.51 kg/m   Initial impression: Likely contusion, strain in noted areas.  Will provide with a knee sleeve.  Awaiting radiology results.  6:44 PM Reassessment performed. Patient appears stable.  Knee sleeve has been applied.  Imaging personally visualized and interpreted including: X-ray of the elbow, knee, ankle, agree no fractures.  Reviewed pertinent lab work and imaging with patient at bedside.  Patient notified of mild arthritis, medial knee.  Questions answered.  Most current vital signs reviewed and are as follows: BP 129/86 (BP Location: Right Arm)   Pulse 79   Temp 98.2 F (36.8 C)   Resp 18   Ht 5\' 3"  (1.6 m)   Wt 108.9 kg   SpO2 99%   BMI 42.51 kg/m   Plan: Discharge to home.   Prescriptions written for: None  Other home care instructions discussed: RICE protocol, OTC meds  ED return instructions discussed: No worsening symptoms  Follow-up instructions discussed: Patient encouraged to follow-up with their PCP in 7 days if symptoms are not improving.                                Medical Decision Making Amount and/or Complexity of Data Reviewed Radiology: ordered.   Patient with orthopedic pain after recent injuries.  X-rays are negative.  Distal extremities are neurovascularly intact.  Agree with RICE protocol and conservative measures at this point.  Follow-up as needed.  No indication for emergent orthopedic consultation.        Final Clinical Impression(s) / ED Diagnoses Final diagnoses:  Contusion of right knee, initial encounter  Contusion of right ankle, initial encounter  Right elbow pain    Rx / DC Orders ED Discharge Orders     None         Renne Crigler, PA-C 11/01/23 1845    Alvira Monday, MD 11/02/23 1053

## 2023-11-01 NOTE — Discharge Instructions (Signed)
 Please read and follow all provided instructions.  Your diagnoses today include:  1. Contusion of right knee, initial encounter   2. Contusion of right ankle, initial encounter   3. Right elbow pain     Tests performed today include: An x-ray of the affected areas - do NOT show any broken bones Vital signs. See below for your results today.   Medications prescribed:  Please use over-the-counter NSAID medications (ibuprofen, naproxen) or Tylenol (acetaminophen) as directed on the packaging for pain -- as long as you do not have any reasons avoid these medications. Reasons to avoid NSAID medications include: weak kidneys, a history of bleeding in your stomach or gut, or uncontrolled high blood pressure or previous heart attack. Reasons to avoid Tylenol include: liver problems or ongoing alcohol use. Never take more than 4000mg  or 8 Extra strength Tylenol in a 24 hour period.     Take any prescribed medications only as directed.  Home care instructions:  Follow any educational materials contained in this packet Follow R.I.C.E. Protocol: R - rest your injury  I  - use ice on injury without applying directly to skin C - compress injury with bandage or splint E - elevate the injury as much as possible  Follow-up instructions: Please follow-up with your primary care provider or the provided orthopedic physician (bone specialist) if you continue to have significant pain in 1 week. In this case you may have a more severe injury that requires further care.   Return instructions:  Please return if your fingers are numb or tingling, appear gray or blue, or you have severe pain (also elevate the arm and loosen splint or wrap if you were given one) Please return to the Emergency Department if you experience worsening symptoms.  Please return if you have any other emergent concerns.  Additional Information:  Your vital signs today were: BP 104/67 (BP Location: Right Arm)   Pulse (!) 115   Temp  98.2 F (36.8 C)   Resp 16   Ht 5\' 3"  (1.6 m)   Wt 108.9 kg   SpO2 97%   BMI 42.51 kg/m  If your blood pressure (BP) was elevated above 135/85 this visit, please have this repeated by your doctor within one month. --------------

## 2023-11-09 ENCOUNTER — Ambulatory Visit: Admitting: Urgent Care

## 2024-04-29 ENCOUNTER — Encounter (HOSPITAL_BASED_OUTPATIENT_CLINIC_OR_DEPARTMENT_OTHER): Payer: Self-pay | Admitting: Emergency Medicine

## 2024-04-29 ENCOUNTER — Emergency Department (HOSPITAL_BASED_OUTPATIENT_CLINIC_OR_DEPARTMENT_OTHER)
Admission: EM | Admit: 2024-04-29 | Discharge: 2024-04-30 | Disposition: A | Discharge status: Home / Self Care | Payer: Worker's Compensation | Attending: Emergency Medicine | Admitting: Emergency Medicine

## 2024-04-29 ENCOUNTER — Emergency Department (HOSPITAL_BASED_OUTPATIENT_CLINIC_OR_DEPARTMENT_OTHER): Payer: Worker's Compensation

## 2024-04-29 DIAGNOSIS — S99911A Unspecified injury of right ankle, initial encounter: Secondary | ICD-10-CM | POA: Diagnosis not present

## 2024-04-29 DIAGNOSIS — S99921A Unspecified injury of right foot, initial encounter: Secondary | ICD-10-CM | POA: Insufficient documentation

## 2024-04-29 DIAGNOSIS — S9031XA Contusion of right foot, initial encounter: Secondary | ICD-10-CM

## 2024-04-29 DIAGNOSIS — S8391XA Sprain of unspecified site of right knee, initial encounter: Secondary | ICD-10-CM

## 2024-04-29 DIAGNOSIS — S8991XA Unspecified injury of right lower leg, initial encounter: Secondary | ICD-10-CM | POA: Diagnosis not present

## 2024-04-29 DIAGNOSIS — S93401A Sprain of unspecified ligament of right ankle, initial encounter: Secondary | ICD-10-CM

## 2024-04-29 DIAGNOSIS — Y990 Civilian activity done for income or pay: Secondary | ICD-10-CM | POA: Diagnosis not present

## 2024-04-29 NOTE — ED Provider Notes (Signed)
 [place] EMERGENCY DEPARTMENT AT [facility] Provider Note   CSN: [id number] Arrival date & time: 04/29/24  2213     Patient presents with: Foot Injury   Shannon Rivas is a 38 y.o. female.   Patient is a 38 year old female presenting with complaints of injuries to her right foot, right ankle, and right knee when a forklift ran over part of her foot/shoe.  This caused her to fall to the ground and injured her ankle and knee as she fell.  She describes some discomfort with ambulation.  No alleviating factors.       Prior to Admission medications   Medication Sig Start Date End Date Taking? Authorizing Provider  ferrous sulfate  325 (65 FE) MG tablet Take 1 tablet (325 mg total) by mouth daily with breakfast. 10/19/21   Tobie Yetta HERO, MD  Misc. Devices (BREAST PUMP) MISC 1 Device by Does not apply route daily. Patient not taking: Reported on 08/19/2020 06/11/20   Milly Planas A, CNM  Multiple Vitamin (MULTIVITAMIN ADULT) TABS Take 1 tablet by mouth daily.    [provider]  Prenat-FeAsp-Meth-FA-DHA w/o A (PRENATE PIXIE ) 10-0.6-0.4-200 MG CAPS Take 1 capsule by mouth daily. Patient not taking: Reported on 10/18/2021 04/16/20   Constant, Peggy, MD  Prenatal Vit-Fe Fumarate-FA (PREPLUS) 27-1 MG TABS Take 1 tablet by mouth daily. Patient not taking: Reported on 10/18/2021 02/08/20   Weinhold, Samantha C, CNM  Probiotic Product (PROBIOTIC PO) Take 1 capsule by mouth daily.    [provider]    Allergies: Patient has no known allergies.    Review of Systems  All other systems reviewed and are negative.   Updated Vital Signs BP 137/87 (BP Location: Right Arm)   Pulse 77   Temp 98.6 F (37 C) (Oral)   Resp 20   Ht 5' 3 (1.6 m)   Wt 115.7 kg   SpO2 100%   Breastfeeding No   BMI 45.17 kg/m   Physical Exam Vitals and nursing note reviewed.  Constitutional:      Appearance: Normal appearance.  HENT:     Head: Normocephalic.   Pulmonary:     Effort: Pulmonary effort is normal.  Musculoskeletal:     Comments: The right knee, right ankle, and right foot are all grossly normal in appearance.  There is no deformity or significant swelling.  She has good range of motion of all joints with no laxity or crepitus.  Skin:    General: Skin is warm and dry.  Neurological:     Mental Status: She is alert and oriented to person, place, and time.     (all labs ordered are listed, but only abnormal results are displayed) Labs Reviewed - No data to display  EKG: None  Radiology: DG Knee Complete 4 Views Right Result Date: 04/29/2024 EXAM: 4 or more VIEW(S) XRAY OF THE  KNEE 04/29/2024 11:20:00 PM COMPARISON: None available. CLINICAL HISTORY: Forklift injury. Worker's comp- patient's right foot and ankle were run over by a forklift @2030  hrs tonight. Painful. Right knee stiffness and decreased ROM, feels pulling in posterior aspect w/ knee flexion. FINDINGS: BONES AND JOINTS: No acute fracture. No focal osseous lesion. No joint dislocation. No significant joint effusion. No significant degenerative changes. SOFT TISSUES: The soft tissues are unremarkable. IMPRESSION: 1. No acute fracture or dislocation. 2. No other significant abnormality. Electronically signed by: Dorethia Molt MD 04/29/2024 11:49 PM EDT RP Workstation: [id number]   DG Ankle Complete Right Result Date: 04/29/2024 EXAM:  3 or more VIEW(S) XRAY OF THE  ANKLE 04/29/2024 11:20:00 PM CLINICAL HISTORY: forklift injury. Worker's comp- patient's right foot and ankle were run over by a forklift @2030  hrs tonight. Painful. Right knee stiffness and decreased ROM, feels pulling in posterior aspect w/ knee flexion. COMPARISON: None available. FINDINGS: BONES AND JOINTS: No acute fracture. No focal osseous lesion. No joint dislocation. SOFT TISSUES: The soft tissues are unremarkable. IMPRESSION: 1. No acute osseous abnormality. Electronically signed by: Dorethia Molt MD  04/29/2024 11:49 PM EDT RP Workstation: [id number]   DG Foot Complete Right Result Date: 04/29/2024 EXAM: 3 or more VIEW(S) XRAY OF THE  FOOT COMPARISON: None available. CLINICAL HISTORY: Forklift injury. Worker's comp- patient's right foot and ankle were run over by a forklift @2030  hrs tonight. Painful. Right knee stiffness and decreased ROM, feels pulling in posterior aspect w/ knee flexion. FINDINGS: BONES AND JOINTS: No acute fracture. No focal osseous lesion. No joint dislocation. SOFT TISSUES: The soft tissues are unremarkable. IMPRESSION: 1. No acute fracture or dislocation. Electronically signed by: Dorethia Molt MD 04/29/2024 11:48 PM EDT RP Workstation: [id number]     Procedures   Medications Ordered in the ED - No data to display                                  Medical Decision Making Amount and/or Complexity of Data Reviewed Radiology: ordered.   X-rays of the right knee, right ankle, and right foot are all negative for fracture.  Patient to be discharged to home with rest, ice, ibuprofen, and follow-up as needed.     Final diagnoses:  None    ED Discharge Orders     None          Geroldine Berg, MD 04/30/24 0002

## 2024-04-29 NOTE — ED Notes (Signed)
 To XR

## 2024-04-29 NOTE — ED Triage Notes (Signed)
 Pt reports a forklift ran over her right foot (1st three toes) tonight at work; also c/o RT ankle and RT knee pain

## 2024-04-30 NOTE — Discharge Instructions (Signed)
Rest.  Take ibuprofen 600 mg every 6 hours as needed for pain.  Follow-up with primary doctor if not improving in the next week.

## 2024-08-23 ENCOUNTER — Ambulatory Visit: Admitting: Family Medicine

## 2024-08-23 NOTE — Progress Notes (Incomplete)
 " [facility]IMARY [facility]ARE [facility] [address] [place] [address] Dept: [phone] Dept Fax: [phone]  New Patient Office Visit   Subjective:    Patient ID: Shannon Rivas, female    DOB: 1985-09-21, 39 y.o..   MRN: [id number]  No chief complaint on file.  History of Present Illness:  Patient is in today to establish care. Previously, Shannon Rivas was established with [facility].   She has reportedly been having some hip and back pain for which she's been trying to have imaging performed.   Past Medical History: Patient Active Problem List   Diagnosis Date Noted   Symptomatic anemia 10/18/2021   Abnormal vaginal bleeding with endometrial thickness less than 16 mm present on transvaginal ultrasound in premenopausal patient 10/18/2021   ASCUS with positive high risk HPV cervical 08/20/2020   Supervision of other normal pregnancy, antepartum 04/16/2020   Past Surgical History:  Procedure Laterality Date   NO PAST SURGERIES     Family History  Problem Relation Age of Onset   Diabetes Mother    Hypertension Mother    Thyroid disease Mother    Ulcers Father    Cancer Maternal Grandmother    Diabetes Maternal Grandmother    Hypertension Maternal Grandmother    COPD Maternal Grandmother    Cancer Maternal Grandfather    Outpatient Medications Prior to Visit  Medication Sig Dispense Refill   ferrous sulfate  325 (65 FE) MG tablet Take 1 tablet (325 mg total) by mouth daily with breakfast. 120 tablet 0   Misc. Devices (BREAST PUMP) MISC 1 Device by Does not apply route daily. (Patient not taking: Reported on 08/19/2020) 1 each 0   Multiple Vitamin (MULTIVITAMIN ADULT) TABS Take 1 tablet by mouth daily.     Prenat-FeAsp-Meth-FA-DHA w/o A (PRENATE PIXIE ) 10-0.6-0.4-200 MG CAPS Take 1 capsule by mouth daily. (Patient not taking: Reported on 10/18/2021) 30 capsule 11   Prenatal Vit-Fe Fumarate-FA (PREPLUS) 27-1 MG TABS Take 1 tablet  by mouth daily. (Patient not taking: Reported on 10/18/2021) 30 tablet 13   Probiotic Product (PROBIOTIC PO) Take 1 capsule by mouth daily.     No facility-administered medications prior to visit.   Allergies[1] Objective:   There were no vitals filed for this visit. There is no height or weight on file to calculate BMI.   General: Well developed, well nourished. No acute distress. HEENT: Normocephalic, non-traumatic. PERRL, EOMI. Conjunctiva clear. External ears normal. EAC and TMs normal bilaterally. Nose    clear without congestion or rhinorrhea. Mucous membranes moist. Oropharynx clear. Good dentition. Neck: Supple. No lymphadenopathy. No thyromegaly. Lungs: Clear to auscultation bilaterally. No wheezing, rales or rhonchi. CV: RRR without murmurs or rubs. Pulses 2+ bilaterally. Abdomen: Soft, non-tender. Bowel sounds positive, normal pitch and frequency. No hepatosplenomegaly. No rebound or guarding. Back: Straight. No CVA tenderness bilaterally. Extremities: Full ROM. No joint swelling or tenderness. No edema noted. Skin: Warm and dry. No rashes. Neuro: CN II-XII intact. Normal sensation and DTR bilaterally. Psych: Alert and oriented. Normal mood and affect.  Health Maintenance Due  Topic Date Due   Hepatitis B Vaccines 19-59 Average Risk (1 of 3 - 19+ 3-dose series) Never done   DTaP/Tdap/Td (2 - Td or Tdap) 01/10/2024   Influenza Vaccine  Never done   COVID-19 Vaccine (1 - 2025-26 season) Never done   Lab Results {Labs (Optional):29002}    Assessment & Plan:   Problem List Items Addressed This Visit   None  No follow-ups on file.   Shannon CHRISTELLA Simpler, MD  I,Emily Lagle,acting as a scribe for Shannon CHRISTELLA Simpler, MD.,have documented all relevant documentation on the behalf of Shannon CHRISTELLA Simpler, MD.  I, Shannon CHRISTELLA Simpler, MD, have reviewed all documentation for this visit. The documentation on 08/23/2024 for the exam, diagnosis, procedures, and orders are all accurate and complete.      [1] No Known Allergies  "
